# Patient Record
Sex: Male | Born: 1945 | Race: Black or African American | Hispanic: No | Marital: Single | Smoking: Never smoker
Health system: Southern US, Community
[De-identification: ages and names within clinical notes are randomized; demographics above are authoritative.]

## PROBLEM LIST (undated history)

## (undated) DIAGNOSIS — E7800 Pure hypercholesterolemia, unspecified: Secondary | ICD-10-CM

## (undated) DIAGNOSIS — H269 Unspecified cataract: Secondary | ICD-10-CM

## (undated) DIAGNOSIS — R1312 Dysphagia, oropharyngeal phase: Secondary | ICD-10-CM

## (undated) DIAGNOSIS — G20 Parkinson's disease: Secondary | ICD-10-CM

## (undated) DIAGNOSIS — H401130 Primary open-angle glaucoma, bilateral, stage unspecified: Secondary | ICD-10-CM

## (undated) DIAGNOSIS — F0390 Unspecified dementia without behavioral disturbance: Secondary | ICD-10-CM

## (undated) DIAGNOSIS — I10 Essential (primary) hypertension: Secondary | ICD-10-CM

## (undated) DIAGNOSIS — D509 Iron deficiency anemia, unspecified: Secondary | ICD-10-CM

## (undated) DIAGNOSIS — G20A1 Parkinson's disease without dyskinesia, without mention of fluctuations: Secondary | ICD-10-CM

## (undated) HISTORY — DX: Parkinson's disease: G20

## (undated) HISTORY — PX: CATARACT EXTRACTION, BILATERAL: SHX1313

## (undated) HISTORY — DX: Pure hypercholesterolemia, unspecified: E78.00

## (undated) HISTORY — DX: Unspecified cataract: H26.9

## (undated) HISTORY — DX: Dysphagia, oropharyngeal phase: R13.12

## (undated) HISTORY — DX: Essential (primary) hypertension: I10

## (undated) HISTORY — DX: Iron deficiency anemia, unspecified: D50.9

## (undated) HISTORY — DX: Parkinson's disease without dyskinesia, without mention of fluctuations: G20.A1

## (undated) HISTORY — DX: Primary open-angle glaucoma, bilateral, stage unspecified: H40.1130

---
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
## 2019-09-06 ENCOUNTER — Other Ambulatory Visit (HOSPITAL_COMMUNITY): Payer: Self-pay

## 2019-09-06 ENCOUNTER — Other Ambulatory Visit (HOSPITAL_COMMUNITY): Payer: Self-pay | Admitting: *Deleted

## 2019-09-06 DIAGNOSIS — R1310 Dysphagia, unspecified: Secondary | ICD-10-CM

## 2019-09-09 ENCOUNTER — Encounter (HOSPITAL_COMMUNITY): Payer: Self-pay

## 2019-09-26 ENCOUNTER — Ambulatory Visit (HOSPITAL_COMMUNITY)
Admission: RE | Admit: 2019-09-26 | Discharge: 2019-09-26 | Disposition: A | Discharge status: Home / Self Care | Payer: Medicare (Managed Care) | Source: Ambulatory Visit | Attending: Internal Medicine | Admitting: Internal Medicine

## 2019-09-26 ENCOUNTER — Other Ambulatory Visit: Payer: Self-pay

## 2019-09-26 DIAGNOSIS — R1310 Dysphagia, unspecified: Secondary | ICD-10-CM | POA: Diagnosis present

## 2019-10-17 ENCOUNTER — Ambulatory Visit (INDEPENDENT_AMBULATORY_CARE_PROVIDER_SITE_OTHER): Payer: Medicare (Managed Care) | Admitting: Neurology

## 2019-10-17 ENCOUNTER — Other Ambulatory Visit: Payer: Self-pay

## 2019-10-17 ENCOUNTER — Encounter: Payer: Self-pay | Admitting: Neurology

## 2019-10-17 VITALS — BP 122/71 | HR 87 | Temp 96.9°F | Ht 68.0 in | Wt 156.0 lb

## 2019-10-17 DIAGNOSIS — R269 Unspecified abnormalities of gait and mobility: Secondary | ICD-10-CM | POA: Diagnosis not present

## 2019-10-17 DIAGNOSIS — G20 Parkinson's disease: Secondary | ICD-10-CM | POA: Diagnosis not present

## 2019-10-17 DIAGNOSIS — G20A1 Parkinson's disease without dyskinesia, without mention of fluctuations: Secondary | ICD-10-CM | POA: Insufficient documentation

## 2019-10-17 MED ORDER — CARBIDOPA-LEVODOPA-ENTACAPONE 50-200-200 MG PO TABS: 1.0000 | ORAL_TABLET | Freq: Every day | ORAL | 11 refills | Status: DC

## 2019-10-17 NOTE — Progress Notes (Signed)
PATIENT: Eric Bennett DOB: 1946-06-15  Chief Complaint  Patient presents with  . Parkinson's Disease    He is here with his neice, Eric Bennett (also his POA). He is here for second opinion. Recently seen by Dr. Karl Bennett in [facility].   Marland Kitchen PCP    Entzminger, Mendel Corning, MD     HISTORICAL  Eric Bennett is Bennett 74 year old male, seen in request by his primary care physician Dr. Oris Bennett, Eric Bennett, accompanied by his niece Eric Bennett for evaluation of Parkinson's disease, initial evaluation was on October 17, 2019.  I have reviewed and summarized the referring note from the referring physician.  He has past medical history of hypertension, hyperlipidemia,  He was diagnosed with Parkinson's disease around 2013, presented with gait abnormality, has been treated with titrating dose of Sinemet, has been on current dose 25/250 mg 4 times Bennett day at 6, 10, 2, 6 PM per patient over the past few years,  Despite titrating dose of levodopa, he had Bennett gradual decline of his functional status, increased gait abnormality, rely on his walker, also have frequent freezing, difficulty initiate gait, fell multiple times,  He has slow worsening hypersialorrhea, wet slurred speech,   I reviewed the last neurology visit by Dr. Everette Bennett on August 26, 2019, increase the Sinemet and to 25/250 mg every 4 hours instead of 5 hours interval, also recommended speech and dysphagia evaluation, he is also taking pramipexole extended release 1.5 mg at bedtime, entacapone 200 mg 3 times Bennett day  MRI of the brain without contrast in February 2018: No evidence of acute abnormality, mild generalized atrophy,  REVIEW OF SYSTEMS: Full 14 system review of systems performed and notable only for as above All other review of systems were negative.  ALLERGIES: No Known Allergies  HOME MEDICATIONS: Current Outpatient Medications  Medication Sig Dispense Refill  . amLODipine (NORVASC) 5 MG tablet Take 1 tablet by mouth daily.     Marland Kitchen atorvastatin (LIPITOR) 40 MG tablet Take 1 tablet by mouth daily.    . carbidopa-levodopa (SINEMET IR) 25-250 MG tablet Take 1 tablet by mouth 4 (four) times Bennett week.    . Cholecalciferol (VITAMIN D3 PO) Take 1,000 Units by mouth daily.    . entacapone (COMTAN) 200 MG tablet Take 1 tablet by mouth daily.    . ferrous sulfate (FEROSUL) 325 (65 FE) MG tablet Take 325 mg by mouth daily.    Marland Kitchen lisinopril-hydrochlorothiazide (ZESTORETIC) 10-12.5 MG tablet Take 1 tablet by mouth daily.    . Pramipexole Dihydrochloride 1.5 MG TB24 Take 1 tablet by mouth at bedtime.     No current facility-administered medications for this visit.    PAST MEDICAL HISTORY: Past Medical History:  Diagnosis Date  . Bilateral cataracts   . Hypercholesteremia   . Hypertension   . Iron deficiency anemia   . Oropharyngeal dysphagia   . Parkinson disease ([place])   . Primary open angle glaucoma (POAG) of both eyes     PAST SURGICAL HISTORY: Past Surgical History:  Procedure Laterality Date  . CATARACT EXTRACTION, BILATERAL      FAMILY HISTORY: Family History  Problem Relation Age of Onset  . Stroke Mother   . Diabetes Mother   . Other Father        brain tumor - not sure if is was cancerous    SOCIAL HISTORY: Social History   Socioeconomic History  . Marital status: Single    Spouse name: Not on file  . Number  of children: 1  . Years of education: 17  . Highest education level: High school graduate  Occupational History  . Occupation: Retired  Tobacco Use  . Smoking status: Never Smoker  . Smokeless tobacco: Never Used  Substance and Sexual Activity  . Alcohol use: Not Currently  . Drug use: Never  . Sexual activity: Not on file  Other Topics Concern  . Not on file  Social History Narrative   Lives alone.   Right-handed.   One cup coffee per day.   Social Determinants of Health   Financial Resource Strain:   . Difficulty of Paying Living Expenses: Not on file  Food Insecurity:   .  Worried About Charity fundraiser in the Last Year: Not on file  . Ran Out of Food in the Last Year: Not on file  Transportation Needs:   . Lack of Transportation (Medical): Not on file  . Lack of Transportation (Non-Medical): Not on file  Physical Activity:   . Days of Exercise per Week: Not on file  . Minutes of Exercise per Session: Not on file  Stress:   . Feeling of Stress : Not on file  Social Connections:   . Frequency of Communication with Friends and Family: Not on file  . Frequency of Social Gatherings with Friends and Family: Not on file  . Attends Religious Services: Not on file  . Active Member of Clubs or Organizations: Not on file  . Attends Archivist Meetings: Not on file  . Marital Status: Not on file  Intimate Partner Violence:   . Fear of Current or Ex-Partner: Not on file  . Emotionally Abused: Not on file  . Physically Abused: Not on file  . Sexually Abused: Not on file     PHYSICAL EXAM   Vitals:   10/17/19 1340  BP: 122/71  Pulse: 87  Temp: (!) 96.9 F (36.1 C)  Weight: 156 lb (70.8 kg)  Height: 5\' 8"  (1.727 m)    Not recorded      Body mass index is 23.72 kg/m.  PHYSICAL EXAMNIATION:  Gen: NAD, conversant, well nourised, well groomed                     Cardiovascular: Regular rate rhythm, no peripheral edema, warm, nontender. Eyes: Conjunctivae clear without exudates or hemorrhage Neck: Supple, no carotid bruits. Pulmonary: Clear to auscultation bilaterally   NEUROLOGICAL EXAM:  MENTAL STATUS: Speech:    wet, slurred speech; fluent and spontaneous with normal comprehension.  Cognition:     Orientation to time, place and person     Normal recent and remote memory     Normal Attention span and concentration     Normal Language, naming, repeating,spontaneous speech     Fund of knowledge   CRANIAL NERVES: CN II: Visual fields are full to confrontation. Pupils are round equal and briskly reactive to light. CN III, IV, VI:  He has vertical eye movement disorder, no ptosis. CN V: Facial sensation is intact to light touch CN VII: Face is symmetric with normal eye closure  CN VIII: Hearing is normal to causal conversation. CN IX, X: Phonation is normal. CN XI: Head turning and shoulder shrug are intact  MOTOR: Examination was taken about 30 minutes after last dose of Sinemet, he only has mild right more than left upper and lower extremity rigidity, bradykinesia, no significant muscle weakness,  REFLEXES: Reflexes are hypoactive and symmetric at the biceps, triceps, knees, and  ankles. Plantar responses are flexor.  SENSORY: Intact to light touch, pinprick and vibratory sensation are intact in fingers and toes.  COORDINATION: There is no trunk or limb dysmetria noted.  GAIT/STANCE: He needs push-up to get up from seated position, leaning forward, decreased arm swing, frequent freezing,   DIAGNOSTIC DATA (LABS, IMAGING, TESTING) - I reviewed patient records, labs, notes, testing and imaging myself where available.   ASSESSMENT AND PLAN  Camil Trembath is Bennett 74 y.o. male    Idiopathic Parkinson's disease  Frequent freezing,  Will change him to Stalevo 200 mg 5 times Bennett day, every 3 hours  Refer him to Home physical therapy  Return to clinic in 2 months  Marcial Pacas, M.D. Ph.D.  [facility] [address] [place], [place] [address] Ph: [phone] Fax: [phone]  CC: Entzminger, Mendel Corning, MD

## 2019-12-19 ENCOUNTER — Ambulatory Visit: Payer: Medicare (Managed Care) | Admitting: Neurology

## 2019-12-19 ENCOUNTER — Ambulatory Visit (INDEPENDENT_AMBULATORY_CARE_PROVIDER_SITE_OTHER): Payer: Medicare (Managed Care) | Admitting: Neurology

## 2019-12-19 ENCOUNTER — Other Ambulatory Visit: Payer: Self-pay

## 2019-12-19 ENCOUNTER — Encounter: Payer: Self-pay | Admitting: Neurology

## 2019-12-19 VITALS — BP 123/80 | HR 106 | Temp 97.0°F | Ht 68.0 in | Wt 155.0 lb

## 2019-12-19 DIAGNOSIS — R269 Unspecified abnormalities of gait and mobility: Secondary | ICD-10-CM | POA: Diagnosis not present

## 2019-12-19 DIAGNOSIS — G20 Parkinson's disease: Secondary | ICD-10-CM | POA: Diagnosis not present

## 2019-12-19 MED ORDER — RASAGILINE MESYLATE 1 MG PO TABS: 1.0000 mg | ORAL_TABLET | Freq: Every day | ORAL | 11 refills | Status: DC

## 2019-12-19 NOTE — Progress Notes (Signed)
PATIENT: Eric Bennett DOB: 1946-04-27  Chief Complaint  Patient presents with  . Parkinson's Disease    He is here with his neice and POA, [name]. Reports his freezing and walking have both significantly improved since changing to Stalevo. He just completed physical therapy through [facility] which was also helpful. Any prescriptions need to be printed for [facility].     HISTORICAL  Eric Bennett is a 74 year old male, seen in request by his primary care physician Dr. Oris Drone, Telford Nab A, accompanied by [name] for evaluation of Parkinson's disease, initial evaluation was on October 17, 2019.  I have reviewed and summarized the referring note from the referring physician.  He has past medical history of hypertension, hyperlipidemia,  He was diagnosed with Parkinson's disease around 2013, presented with gait abnormality, has been treated with titrating dose of Sinemet, has been on current dose 25/250 mg 4 times a day at 6, 10, 2, 6 PM per patient over the past few years,  Despite titrating dose of levodopa, he had a gradual decline of his functional status, increased gait abnormality, rely on his walker, also have frequent freezing, difficulty initiate gait, fell multiple times,  He has slow worsening hypersialorrhea, wet slurred speech,  I reviewed the last neurology visit by Dr. Everette Rank on August 26, 2019, increase the Sinemet  to 25/250 mg every 4 hours instead of 5 hours interval, also recommended speech and dysphagia evaluation, he is also taking pramipexole extended release 1.5 mg at bedtime, entacapone 200 mg 3 times a day  MRI of the brain without contrast in February 2018: No evidence of acute abnormality, mild generalized atrophy,  UPDATE December 19 2019: Following last visit in January 2021, I have changed him to Stalevo 205 times a day at 6, 9, 12, 15, 18, (from previous Sinemet 25/250 mg 4 times a day, entacapone 200 mg 3 times a day), is also taking extended release  pramipexole 1.5 mg at bedtime, he can move much better, much less frequent freezing spells,  REVIEW OF SYSTEMS: Full 14 system review of systems performed and notable only for as above All other review of systems were negative.  ALLERGIES: No Known Allergies  HOME MEDICATIONS: Current Outpatient Medications  Medication Sig Dispense Refill  . amLODipine (NORVASC) 5 MG tablet Take 1 tablet by mouth daily.    Marland Kitchen atorvastatin (LIPITOR) 40 MG tablet Take 1 tablet by mouth daily.    . carbidopa-levodopa-entacapone (STALEVO 200) 50-200-200 MG tablet Take 1 tablet by mouth 5 (five) times daily. 150 tablet 11  . Cholecalciferol (VITAMIN D3 PO) Take 1,000 Units by mouth daily.    . ferrous sulfate (FEROSUL) 325 (65 FE) MG tablet Take 325 mg by mouth daily.    Marland Kitchen lisinopril-hydrochlorothiazide (ZESTORETIC) 10-12.5 MG tablet Take 1 tablet by mouth daily.    . Pramipexole Dihydrochloride 1.5 MG TB24 Take 1 tablet by mouth at bedtime.     No current facility-administered medications for this visit.    PAST MEDICAL HISTORY: Past Medical History:  Diagnosis Date  . Bilateral cataracts   . Hypercholesteremia   . Hypertension   . Iron deficiency anemia   . Oropharyngeal dysphagia   . Parkinson disease ([place])   . Primary open angle glaucoma (POAG) of both eyes     PAST SURGICAL HISTORY: Past Surgical History:  Procedure Laterality Date  . CATARACT EXTRACTION, BILATERAL      FAMILY HISTORY: Family History  Problem Relation Age of Onset  . Stroke Mother   .  Diabetes Mother   . Other Father        brain tumor - not sure if is was cancerous    SOCIAL HISTORY: Social History   Socioeconomic History  . Marital status: Single    Spouse name: Not on file  . Number of children: 1  . Years of education: 43  . Highest education level: High school graduate  Occupational History  . Occupation: Retired  Tobacco Use  . Smoking status: Never Smoker  . Smokeless tobacco: Never Used  Substance  and Sexual Activity  . Alcohol use: Not Currently  . Drug use: Never  . Sexual activity: Not on file  Other Topics Concern  . Not on file  Social History Narrative   Lives alone.   Right-handed.   One cup coffee per day.   Social Determinants of Health   Financial Resource Strain:   . Difficulty of Paying Living Expenses:   Food Insecurity:   . Worried About Charity fundraiser in the Last Year:   . Arboriculturist in the Last Year:   Transportation Needs:   . Film/video editor (Medical):   Marland Kitchen Lack of Transportation (Non-Medical):   Physical Activity:   . Days of Exercise per Week:   . Minutes of Exercise per Session:   Stress:   . Feeling of Stress :   Social Connections:   . Frequency of Communication with Friends and Family:   . Frequency of Social Gatherings with Friends and Family:   . Attends Religious Services:   . Active Member of Clubs or Organizations:   . Attends Archivist Meetings:   Marland Kitchen Marital Status:   Intimate Partner Violence:   . Fear of Current or Ex-Partner:   . Emotionally Abused:   Marland Kitchen Physically Abused:   . Sexually Abused:      PHYSICAL EXAM   Vitals:   12/19/19 0851  BP: 123/80  Pulse: (!) 106  Temp: (!) 97 F (36.1 C)  Weight: 155 lb (70.3 kg)  Height: 5\' 8"  (1.727 m)    Not recorded      Body mass index is 23.57 kg/m.  PHYSICAL EXAMNIATION:  Gen: NAD, conversant, well nourised, well groomed                     Cardiovascular: Regular rate rhythm, no peripheral edema, warm, nontender. Eyes: Conjunctivae clear without exudates or hemorrhage Neck: Supple, no carotid bruits. Pulmonary: Clear to auscultation bilaterally   NEUROLOGICAL EXAM:  MENTAL STATUS: Speech:    wet, slurred speech; fluent and spontaneous with normal comprehension.  Cognition:     Orientation to time, place and person     Normal recent and remote memory     Normal Attention span and concentration     Normal Language, naming,  repeating,spontaneous speech     Fund of knowledge   CRANIAL NERVES: CN II: Visual fields are full to confrontation. Pupils are round equal and briskly reactive to light. CN III, IV, VI: He has vertical eye movement disorder, no ptosis. CN V: Facial sensation is intact to light touch CN VII: Face is symmetric with normal eye closure  CN VIII: Hearing is normal to causal conversation. CN IX, X: Phonation is normal. CN XI: Head turning and shoulder shrug are intact  MOTOR: Mild to moderate right limb more than left rigidity, bradykinesia, no weakness,   REFLEXES: Reflexes are hypoactive and symmetric at the biceps, triceps, knees, and ankles.  Plantar responses are flexor.  SENSORY: Intact to light touch, pinprick and vibratory sensation are intact in fingers and toes.  COORDINATION: There is no trunk or limb dysmetria noted.  GAIT/STANCE: He needs push-up to get up from seated position, leaning forward, mildly decreased stride, occasionally freezing when turning, but much improved compared to previous study in January   DIAGNOSTIC DATA (LABS, IMAGING, TESTING) - I reviewed patient records, labs, notes, testing and imaging myself where available.   ASSESSMENT AND PLAN  Yezen Landman is a 74 y.o. male    Idiopathic Parkinson's disease  Frequent freezing,  Keep Stalevo 200 mg 5 times a day, every 3 hours  Pramipexole extended release 1.5 mg every night  Azilect 1 mg daily  He is in [facility], which has helped him significantly  Marcial Pacas, M.D. Ph.D.  [facility] [address] [place], [place] [address] Ph: [phone] Fax: [phone]  CC: Entzminger, Mendel Corning, MD

## 2019-12-23 ENCOUNTER — Encounter: Payer: Self-pay | Admitting: Neurology

## 2020-04-09 ENCOUNTER — Telehealth: Payer: Self-pay | Admitting: Neurology

## 2020-04-09 NOTE — Telephone Encounter (Signed)
I was able to talk with her niece Eric Bennett At [phone]  Patient was found to have heart rate variations, sometimes beating fast by his primary care physician Dr. Oris Drone, Mendel Corning, MD, per Eric Bennett this has raised the concern that his heart rate variations due to stalevo.  Patient does walk better with Stalevo 200mg  five times a day.  He was living by himself, not eating, drinking well, now Eric Bennett has taken him in, he is eating better, drinking more liquid, he has been more stable,  Keep current dose of  carbidopa-levodopa-entacapone (STALEVO 200) 50-200-200 MG tablet

## 2020-04-09 NOTE — Telephone Encounter (Signed)
Dr. Oris Drone from [facility] called needing to speak to provider about some changes that she and the caregiver have noticed. BP seems to be affected by the medications he takes for Parkinson's. Please call to discuss.

## 2020-04-09 NOTE — Telephone Encounter (Signed)
Dr. Oris Drone [phone]

## 2020-04-21 ENCOUNTER — Telehealth: Payer: Self-pay | Admitting: Neurology

## 2020-04-21 NOTE — Telephone Encounter (Signed)
Pt's niece [name], on [place] called wanting to discuss the pt's carbidopa-levodopa-entacapone (STALEVO 200) 50-200-200 MG tablet She states that his heart rate gets fast when he is on this medication and she is wanting to be advised if the medication should be changed or if the dosage should be lowered.

## 2020-04-21 NOTE — Telephone Encounter (Signed)
04/09/20 4:08 PM Note I was able to talk with her niece [name] At [phone]  Patient was found to have heart rate variations, sometimes beating fast by his primary care physician Dr. Oris Drone, Mendel Corning, MD, per [name] this has raised the concern that his heart rate variations due to stalevo.  Patient does walk better with Stalevo 200mg  five times a day.  He was living by himself, not eating, drinking well, now [name] has taken him in, he is eating better, drinking more liquid, he has been more stable,  Keep current dose of  carbidopa-levodopa-entacapone (STALEVO 200) 50-200-200 MG tablet     Reviewed the phone call with her, from Dr. Krista Blue, on 04/09/20. The patient's heart rate is still fluctuating. She plans to contact his PCP today for further evaluation.

## 2020-05-05 ENCOUNTER — Telehealth: Payer: Self-pay | Admitting: Neurology

## 2020-05-05 NOTE — Telephone Encounter (Signed)
The patient's follow up has been moved to an earlier date w/ Dr. Krista Blue. Appt scheduled on 06/04/20 at 9am with arrival time of 8:30am for check-in. I called Ria Comment back at [facility]. I left the new date and time on her voicemail. I also provided our number for her or the patient to call back, if this does not work well.

## 2020-05-05 NOTE — Telephone Encounter (Signed)
Ria Comment from [facility] called stating that the pt's PCP is wanting the pt to be seen in the next 2-3 weeks. There are no sooner appts for MD nor NP. Please advise.

## 2020-06-04 ENCOUNTER — Encounter: Payer: Self-pay | Admitting: Neurology

## 2020-06-04 ENCOUNTER — Telehealth: Payer: Self-pay | Admitting: Neurology

## 2020-06-04 ENCOUNTER — Ambulatory Visit (INDEPENDENT_AMBULATORY_CARE_PROVIDER_SITE_OTHER): Payer: Medicare (Managed Care) | Admitting: Neurology

## 2020-06-04 VITALS — BP 100/67 | HR 90 | Ht 68.0 in | Wt 143.5 lb

## 2020-06-04 DIAGNOSIS — R269 Unspecified abnormalities of gait and mobility: Secondary | ICD-10-CM

## 2020-06-04 DIAGNOSIS — R413 Other amnesia: Secondary | ICD-10-CM | POA: Diagnosis not present

## 2020-06-04 DIAGNOSIS — G20 Parkinson's disease: Secondary | ICD-10-CM | POA: Diagnosis not present

## 2020-06-04 HISTORY — DX: Other amnesia: R41.3

## 2020-06-04 MED ORDER — RASAGILINE MESYLATE 1 MG PO TABS: 1.0000 mg | ORAL_TABLET | Freq: Every day | ORAL | 4 refills | Status: DC

## 2020-06-04 MED ORDER — CARBIDOPA-LEVODOPA-ENTACAPONE 50-200-200 MG PO TABS: 1.0000 | ORAL_TABLET | Freq: Every day | ORAL | 11 refills | Status: DC

## 2020-06-04 NOTE — Telephone Encounter (Signed)
06/04/2020 -- Entzminger, Mendel Corning, MD Called and Relayed to [employer] for MRI because she is [facility]CP [facility] and she has to Order . Dr. Krista Blue will be able to see MRI in Epic . I will make Dr. Krista Blue and Raquel Sarna aware. Eric Bennett

## 2020-06-04 NOTE — Telephone Encounter (Signed)
Noted, thank you

## 2020-06-04 NOTE — Telephone Encounter (Signed)
[facility] order sent to [facility]. No auth they will reach out to the patient to schedule.  

## 2020-06-04 NOTE — Progress Notes (Addendum)
PATIENT: Eric Bennett DOB: May 20, 1946  Chief Complaint  Patient presents with  . Parkinson's disease    hall rm, 6 month FU, niece- Eric Bennett, "has fallen; also discuss Iron tabs re: tachycardia"     HISTORICAL  Eric Bennett is a 74 year old male, seen in request by his primary care physician Dr. Oris Drone, Telford Nab A, accompanied by his niece Eric Bennett for evaluation of Parkinson's disease, initial evaluation was on October 17, 2019.  I have reviewed and summarized the referring note from the referring physician.  He has past medical history of hypertension, hyperlipidemia,  He was diagnosed with Parkinson's disease around 2013, presented with gait abnormality, has been treated with titrating dose of Sinemet, has been on current dose 25/250 mg 4 times a day at 6, 10, 2, 6 PM per patient over the past few years,  Despite titrating dose of levodopa, he had a gradual decline of his functional status, increased gait abnormality, rely on his walker, also have frequent freezing, difficulty initiate gait, fell multiple times,  He has slow worsening hypersialorrhea, wet slurred speech,  I reviewed the last neurology visit by Dr. Everette Rank on August 26, 2019, increase the Sinemet  to 25/250 mg every 4 hours instead of 5 hours interval, also recommended speech and dysphagia evaluation, he is also taking pramipexole extended release 1.5 mg at bedtime, entacapone 200 mg 3 times a day  MRI of the brain without contrast in February 2018: No evidence of acute abnormality, mild generalized atrophy,  UPDATE December 19 2019: Following last visit in January 2021, I have changed him to Stalevo 200 5 times a day at 57, 9, 43, 15, 18, (from previous Sinemet 25/250 mg 4 times a day, entacapone 200 mg 3 times a day), is also taking extended release pramipexole 1.5 mg at bedtime, he can move much better, much less frequent freezing spells,   UPDATE Sept 16 2021: He is accompanied by his niece Eric Bennett at today's visit,  will check on him on a daily basis, he also has caregiver with him majority of the day, he still lives by himself,  He was noted to have significant improvement for a while with change of his Parkinson medications, to Stalevo 200 5 times a day, but it is difficult for him to get 5 doses in due to his participate in pace program, he is now only take 4 times a day,  There was acute change few weeks ago, he become required, with increased gait abnormality, hard of hearing, increased memory loss, rely on his niece to provide most the history,   REVIEW OF SYSTEMS: Full 14 system review of systems performed and notable only for as above All other review of systems were negative.  ALLERGIES: No Known Allergies  HOME MEDICATIONS: Current Outpatient Medications  Medication Sig Dispense Refill  . amLODipine (NORVASC) 5 MG tablet Take 1 tablet by mouth daily.    Marland Kitchen atorvastatin (LIPITOR) 40 MG tablet Take 1 tablet by mouth daily.    . carbidopa-levodopa-entacapone (STALEVO 200) 50-200-200 MG tablet Take 1 tablet by mouth 5 (five) times daily. 150 tablet 11  . Cholecalciferol (VITAMIN D3 PO) Take 1,000 Units by mouth daily.    Marland Kitchen lisinopril-hydrochlorothiazide (ZESTORETIC) 10-12.5 MG tablet Take 1 tablet by mouth daily.    . Pramipexole Dihydrochloride 1.5 MG TB24 Take 1 tablet by mouth at bedtime.    . rasagiline (AZILECT) 1 MG TABS tablet Take 1 tablet (1 mg total) by mouth daily. 30 tablet 11  . ferrous  sulfate (FEROSUL) 325 (65 FE) MG tablet Take 325 mg by mouth daily. (Patient not taking: Reported on 06/04/2020)     No current facility-administered medications for this visit.    PAST MEDICAL HISTORY: Past Medical History:  Diagnosis Date  . Bilateral cataracts   . Hypercholesteremia   . Hypertension   . Iron deficiency anemia   . Oropharyngeal dysphagia   . Parkinson disease ([place])   . Primary open angle glaucoma (POAG) of both eyes     PAST SURGICAL HISTORY: Past Surgical History:   Procedure Laterality Date  . CATARACT EXTRACTION, BILATERAL      FAMILY HISTORY: Family History  Problem Relation Age of Onset  . Stroke Mother   . Diabetes Mother   . Other Father        brain tumor - not sure if is was cancerous    SOCIAL HISTORY: Social History   Socioeconomic History  . Marital status: Single    Spouse name: Not on file  . Number of children: 1  . Years of education: 62  . Highest education level: High school graduate  Occupational History  . Occupation: Retired  Tobacco Use  . Smoking status: Never Smoker  . Smokeless tobacco: Never Used  Substance and Sexual Activity  . Alcohol use: Not Currently  . Drug use: Never  . Sexual activity: Not on file  Other Topics Concern  . Not on file  Social History Narrative   06/04/20 Lives alone. Caregivers daily   Right-handed.   One cup coffee per day.   Social Determinants of Health   Financial Resource Strain:   . Difficulty of Paying Living Expenses: Not on file  Food Insecurity:   . Worried About Charity fundraiser in the Last Year: Not on file  . Ran Out of Food in the Last Year: Not on file  Transportation Needs:   . Lack of Transportation (Medical): Not on file  . Lack of Transportation (Non-Medical): Not on file  Physical Activity:   . Days of Exercise per Week: Not on file  . Minutes of Exercise per Session: Not on file  Stress:   . Feeling of Stress : Not on file  Social Connections:   . Frequency of Communication with Friends and Family: Not on file  . Frequency of Social Gatherings with Friends and Family: Not on file  . Attends Religious Services: Not on file  . Active Member of Clubs or Organizations: Not on file  . Attends Archivist Meetings: Not on file  . Marital Status: Not on file  Intimate Partner Violence:   . Fear of Current or Ex-Partner: Not on file  . Emotionally Abused: Not on file  . Physically Abused: Not on file  . Sexually Abused: Not on file      PHYSICAL EXAM   Vitals:   06/04/20 0857  BP: 100/67  Pulse: 90  Weight: 143 lb 8 oz (65.1 kg)  Height: 5\' 8"  (1.727 m)   Not recorded     Body mass index is 21.82 kg/m.  PHYSICAL EXAMNIATION:  Gen: NAD, conversant, well nourised, well groomed           NEUROLOGICAL EXAM:  MENTAL STATUS: MMSE - Mini Mental State Exam 06/04/2020  Orientation to time 1  Orientation to Place 2  Registration 3  Attention/ Calculation 3  Recall 1  Language- name 2 objects 2  Language- repeat 1  Language- follow 3 step command 3  Language- read &  follow direction 1  Write a sentence 0  Copy design 0  Total score 17  Soft voice, stuttering, follow commands,   CRANIAL NERVES: CN II: Visual fields are full to confrontation. Pupils are round equal and briskly reactive to light. CN III, IV, VI: He has vertical eye movement disorder, no ptosis. CN V: Facial sensation is intact to light touch CN VII: Face is symmetric with normal eye closure  CN VIII: Hearing is normal to causal conversation. CN IX, X: Phonation is normal. CN XI: Head turning and shoulder shrug are intact  MOTOR: Mild to moderate right limb more than left rigidity, bradykinesia, no weakness,   REFLEXES: Reflexes are hypoactive and symmetric at the biceps, triceps, knees, and ankles. Plantar responses are flexor.  SENSORY: Intact to light touch, pinprick and vibratory sensation are intact in fingers and toes.  COORDINATION: There is no trunk or limb dysmetria noted.  GAIT/STANCE: He needs push-up to get up from seated position, leaning forward, mildly decreased stride, occasionally freezing when turning,    DIAGNOSTIC DATA (LABS, IMAGING, TESTING) - I reviewed patient records, labs, notes, testing and imaging myself where available.   ASSESSMENT AND PLAN  Toretto Tingler is a 74 y.o. male    Idiopathic Parkinson's disease Acute onset of memory loss,  MRI of the brain to rule out central nervous system  structural lesion such as stroke-will be ordered by his PCP, he is at [facility]  Keep Stalevo 200 mg 5 times a day, every 3 hours  Stop Mirapex extended release 1.5 mg every night  Keep Azilect 1 mg daily  He is in [facility] program, which has helped him significantly, receiving physical therapy regularly, attending day program  Eric Bennett, M.D. Ph.D.  [facility] [address] [place], [place] [address] Ph: [phone] Fax: [phone]  CC: Entzminger, Eric Corning, MD

## 2020-06-04 NOTE — Patient Instructions (Addendum)
Stop Mirapex  Change Stalevo 200mg  to 8am, 11am, 2pm, 5pm, 8pm.

## 2020-06-05 ENCOUNTER — Encounter: Payer: Self-pay | Admitting: Neurology

## 2020-06-08 ENCOUNTER — Other Ambulatory Visit: Payer: Self-pay | Admitting: Internal Medicine

## 2020-06-08 DIAGNOSIS — G20 Parkinson's disease: Secondary | ICD-10-CM

## 2020-06-08 NOTE — Addendum Note (Signed)
Addended by: Marcial Pacas on: 06/08/2020 02:06 PM   Modules accepted: Orders

## 2020-06-15 ENCOUNTER — Telehealth: Payer: Self-pay | Admitting: Neurology

## 2020-06-15 NOTE — Telephone Encounter (Signed)
Patient was on Stalevo four times a day before it was increased to five times a day.  If his blood pressure stabilize, and he still has trouble walking, may increase the dosage back to four times a day.  If patient or she are happy about his walking, it is ok to stay on 3 tablets a day

## 2020-06-15 NOTE — Telephone Encounter (Signed)
Pt called, Dr. Krista Blue increase carbidopa-levodopa-entacapone (STALEVO 200) 50-200-200 MG tablet to 5 times a day. Started to effect his BP, lowed to 92/67, HR 132. Ms. Kenton Kingfisher called [facility], [facility] instructed Pt to take 3 times ad day. [facility] suggested notify Dr. Krista Blue.

## 2020-06-15 NOTE — Telephone Encounter (Signed)
I returned the call to the patient's niece who verbalized understanding of the plan below. She will attend his next follow up with him on 09/02/20. She will call prior to this date with any questions.

## 2020-06-15 NOTE — Telephone Encounter (Signed)
Chart reviewed, most recent office visit June 04, 2020, niece Eric Bennett reported that he was doing better with Stalevo 200 mg, higher dose, was on 4 tablets a day, increased to 5 times a day,  Please double check with his niece how patient is doing, the exact dosage of his medications,  He is also on 2 agents for his blood pressure, Norvasc, Zestoretic,   May consider other possibilities for his low blood pressure, such as urinary tract infection, upper respiratory infection, too much of the blood pressure medications.

## 2020-06-15 NOTE — Telephone Encounter (Signed)
I called his niece [name] (caregiver on [place]). Reports after the patient started Stalevo five times daily, he began to have the following symptoms: drowsiness, decreased appetite, decreased BP. She was unable to assess if the increased dosage actually helped with his movements because he was not able to get out of the bed. Blood pressures were documented at lowest point of 87/59 and 84/54.  She called [facility] because our phones were out of order on day of her concern. She was instructed to decrease Stalevo to three times daily and call us today. She is currently giving it to him at 8am, 2pm and 7:30pm. His most recent vitals were 114/70, 74 - 92/57, 133 - 107/77, 95. She feels he is much more alert on the lower dose and eating better. He is still having some freezing spells throughout the day. He has also continued Azilect 1mg  daily.   She would like to know if any other changes need to be made to his therapy.

## 2020-06-25 ENCOUNTER — Ambulatory Visit: Payer: Medicare (Managed Care) | Admitting: Neurology

## 2020-06-27 ENCOUNTER — Ambulatory Visit
Admission: RE | Admit: 2020-06-27 | Discharge: 2020-06-27 | Disposition: A | Discharge status: Home / Self Care | Payer: Medicare (Managed Care) | Source: Ambulatory Visit | Attending: Internal Medicine | Admitting: Internal Medicine

## 2020-06-27 ENCOUNTER — Other Ambulatory Visit: Payer: Medicare (Managed Care)

## 2020-06-27 DIAGNOSIS — G20 Parkinson's disease: Secondary | ICD-10-CM

## 2020-09-02 ENCOUNTER — Ambulatory Visit (INDEPENDENT_AMBULATORY_CARE_PROVIDER_SITE_OTHER): Payer: Medicare (Managed Care) | Admitting: Neurology

## 2020-09-02 ENCOUNTER — Encounter: Payer: Self-pay | Admitting: Neurology

## 2020-09-02 ENCOUNTER — Telehealth: Payer: Self-pay | Admitting: Neurology

## 2020-09-02 VITALS — BP 102/68 | HR 122 | Ht 68.0 in | Wt 143.5 lb

## 2020-09-02 DIAGNOSIS — R269 Unspecified abnormalities of gait and mobility: Secondary | ICD-10-CM

## 2020-09-02 DIAGNOSIS — M4802 Spinal stenosis, cervical region: Secondary | ICD-10-CM | POA: Diagnosis not present

## 2020-09-02 DIAGNOSIS — G20 Parkinson's disease: Secondary | ICD-10-CM | POA: Diagnosis not present

## 2020-09-02 DIAGNOSIS — R413 Other amnesia: Secondary | ICD-10-CM

## 2020-09-02 DIAGNOSIS — G20A1 Parkinson's disease without dyskinesia, without mention of fluctuations: Secondary | ICD-10-CM

## 2020-09-02 NOTE — Progress Notes (Signed)
PATIENT: Eric Bennett DOB: [birth year]  Chief Complaint  Patient presents with  . Parkinson's Disease    He is here with his niece, Eric Bennett. They would like to review his MRI of brain. Recent MMSE 17/30. He is currently taking Stalevo three times daily and Azilect once daily. Reports gait and cognition have improved with the decreased dose of Stalevo. No issues with freezing spells. No falls.     HISTORICAL  Eric Bennett is a 74 year old male, seen in request by his primary care physician Dr. Oris Drone, Telford Nab A, accompanied by his niece Eric Bennett for evaluation of Parkinson's disease, initial evaluation was on October 17, 2019.  I have reviewed and summarized the referring note from the referring physician.  He has past medical history of hypertension, hyperlipidemia,  He was diagnosed with Parkinson's disease around 2013, presented with gait abnormality, has been treated with titrating dose of Sinemet, has been on current dose 25/250 mg 4 times a day at 6, 10, 2, 6 PM per patient over the past few years,  Despite titrating dose of levodopa, he had a gradual decline of his functional status, increased gait abnormality, rely on his walker, also have frequent freezing, difficulty initiate gait, fell multiple times,  He has slow worsening hypersialorrhea, wet slurred speech,  I reviewed the last neurology visit by Eric Bennett on August 26, 2019, increase the Sinemet  to 25/250 mg every 4 hours instead of 5 hours interval, also recommended speech and dysphagia evaluation, he is also taking pramipexole extended release 1.5 mg at bedtime, entacapone 200 mg 3 times a day  MRI of the brain without contrast in February 2018: No evidence of acute abnormality, mild generalized atrophy,  UPDATE December 19 2019: Following last visit in January 2021, I have changed him to Stalevo 200 5 times a day at 71, 9, 12, 15, 18, (from previous Sinemet 25/250 mg 4 times a day, entacapone 200 mg 3 times a day), is  also taking extended release pramipexole 1.5 mg at bedtime, he can move much better, much less frequent freezing spells,   UPDATE Sept 16 2021: He is accompanied by his niece Eric Bennett at today's visit, will check on him on a daily basis, he also has caregiver with him majority of the day, he still lives by himself,  He was noted to have significant improvement for a while with change of his Parkinson medications, to Stalevo 200 5 times a day, but it is difficult for him to get 5 doses in due to his participate in pace program, he is now only take 4 times a day,  There was acute change few weeks ago, he become quiet with increased gait abnormality, hard of hearing, increased memory loss, rely on his niece to provide most the history,  UPDATE Sep 02 2020: He is accompanied by his niece Eric Bennett at today's visit, overall doing very well, only taking Stalevo 200 3 times daily,  I personally reviewed MRI in October 2021, mild generalized atrophy, supratentorium small vessel disease, no acute abnormality, 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,  Patient has urinary frequency, ambulate okay with walker, has caregiver 6 hours each day, his niece Eric Bennett check on him daily   REVIEW OF SYSTEMS: Full 14 system review of systems performed and notable only for as above All other review of systems were negative.  ALLERGIES: No Known Allergies  HOME MEDICATIONS: Current Outpatient Medications  Medication Sig Dispense Refill  . atorvastatin (LIPITOR) 40 MG tablet Take 1  tablet by mouth daily.    . carbidopa-levodopa-entacapone (STALEVO 200) 50-200-200 MG tablet Take 1 tablet by mouth 5 (five) times daily. 150 tablet 11  . Cholecalciferol (VITAMIN D3 PO) Take 25 mg by mouth daily.    Marland Kitchen lisinopril-hydrochlorothiazide (ZESTORETIC) 10-12.5 MG tablet Take 1 tablet by mouth daily.    . potassium chloride SA (KLOR-CON) 20 MEQ tablet Take 20 mEq by mouth daily.    . rasagiline (AZILECT) 1 MG  TABS tablet Take 1 tablet (1 mg total) by mouth daily. 90 tablet 4   No current facility-administered medications for this visit.    PAST MEDICAL HISTORY: Past Medical History:  Diagnosis Date  . Bilateral cataracts   . Hypercholesteremia   . Hypertension   . Iron deficiency anemia   . Oropharyngeal dysphagia   . Parkinson disease ([place])   . Primary open angle glaucoma (POAG) of both eyes     PAST SURGICAL HISTORY: Past Surgical History:  Procedure Laterality Date  . CATARACT EXTRACTION, BILATERAL      FAMILY HISTORY: Family History  Problem Relation Age of Onset  . Stroke Mother   . Diabetes Mother   . Other Father        brain tumor - not sure if is was cancerous    SOCIAL HISTORY: Social History   Socioeconomic History  . Marital status: Single    Spouse name: Not on file  . Number of children: 1  . Years of education: 75  . Highest education level: High school graduate  Occupational History  . Occupation: Retired  Tobacco Use  . Smoking status: Never Smoker  . Smokeless tobacco: Never Used  Substance and Sexual Activity  . Alcohol use: Not Currently  . Drug use: Never  . Sexual activity: Not on file  Other Topics Concern  . Not on file  Social History Narrative   06/04/20 Lives alone. Caregivers daily   Right-handed.   One cup coffee per day.   Social Determinants of Health   Financial Resource Strain: Not on file  Food Insecurity: Not on file  Transportation Needs: Not on file  Physical Activity: Not on file  Stress: Not on file  Social Connections: Not on file  Intimate Partner Violence: Not on file     PHYSICAL EXAM   Vitals:   09/02/20 1114  BP: 102/68  Pulse: (!) 122  Weight: 143 lb 8 oz (65.1 kg)  Height: 5\' 8"  (1.727 m)   Not recorded     Body mass index is 21.82 kg/m.  PHYSICAL EXAMNIATION:  Gen: NAD, conversant, well nourised, well groomed           NEUROLOGICAL EXAM:  MENTAL STATUS: MMSE - Mini Mental State Exam  06/04/2020  Orientation to time 1  Orientation to Place 2  Registration 3  Attention/ Calculation 3  Recall 1  Language- name 2 objects 2  Language- repeat 1  Language- follow 3 step command 3  Language- read & follow direction 1  Write a sentence 0  Copy design 0  Total score 17  Soft voice, stuttering, follow commands,   CRANIAL NERVES: CN II: Visual fields are full to confrontation. Pupils are round equal and briskly reactive to light. CN III, IV, VI: He has vertical eye movement disorder, no ptosis. CN V: Facial sensation is intact to light touch CN VII: Face is symmetric with normal eye closure  CN VIII: Hearing is normal to causal conversation. CN IX, X: Phonation is normal. CN XI:  Head turning and shoulder shrug are intact  MOTOR: Mild to moderate right limb more than left rigidity, bradykinesia, no weakness,   REFLEXES: Reflexes are hypoactive and symmetric at the biceps, triceps, 2/2 at knees, and trace at ankles. Plantar responses are flexor.  SENSORY: Intact to light touch, pinprick and vibratory sensation are intact in fingers and toes.  COORDINATION: There is no trunk or limb dysmetria noted.  GAIT/STANCE: He needs push-up to get up from seated position, leaning forward, relies his walker.   DIAGNOSTIC DATA (LABS, IMAGING, TESTING) - I reviewed patient records, labs, notes, testing and imaging myself where available.   ASSESSMENT AND PLAN  Eric Bennett is a 74 y.o. male    Idiopathic Parkinson's disease Memory loss,  MRI of the brain to rule out central nervous system structural lesion such as stroke-will be ordered by his PCP, he is at [facility] 200 mg 3 times a day at 8, 12, 4pm  Stop Mirapex extended release 1.5 mg every night  Keep Azilect 1 mg daily  He is in [facility] program, which has helped him significantly, receiving physical therapy regularly, attending day program.  Cervical Stenosis  MRI cervical    Eric Bennett, M.D.  Ph.D.  [facility] [address] [place], [place] [address] Ph: [phone] Fax: [phone]  CC: Entzminger, Mendel Corning, MD

## 2020-09-02 NOTE — Telephone Encounter (Signed)
[facility] order sent to [facility]. No auth they will reach out to the patient to schedule.

## 2020-09-15 ENCOUNTER — Ambulatory Visit
Admission: RE | Admit: 2020-09-15 | Discharge: 2020-09-15 | Disposition: A | Discharge status: Home / Self Care | Payer: Medicare (Managed Care) | Source: Ambulatory Visit | Attending: Neurology | Admitting: Neurology

## 2020-09-15 DIAGNOSIS — M4802 Spinal stenosis, cervical region: Secondary | ICD-10-CM | POA: Diagnosis not present

## 2020-09-21 ENCOUNTER — Telehealth: Payer: Self-pay | Admitting: *Deleted

## 2020-09-21 NOTE — Telephone Encounter (Signed)
I spoke to the patient's niece, Eric Bennett, on [place]. She verbalized understanding of the MRI results and will call his PCP to follow up. A copy of the cervical MRI report has been faxed to his PCP, Dr. Sherrin Daisy, at [phone]. Fax confirmation received.

## 2020-09-21 NOTE — Telephone Encounter (Signed)
-----   Message from Huston Foley, MD sent at 09/21/2020  9:27 AM EST ----- Please call patient regarding his neck MRI without contrast.  There are degenerative changes at multiple levels, but nothing that would warrant immediate surgical referral, in particular, no herniated disc or compression of the spinal cord.  The degenerative/arthritic changes are most prominent at C3-4.  Incidentally, there is also a soft tissue swelling in the neck skin and soft tissue in the back.  I would recommend follow-up with primary care physician to evaluate for the soft tissue swelling.

## 2020-09-21 NOTE — Progress Notes (Signed)
Please call patient regarding his neck MRI without contrast.  There are degenerative changes at multiple levels, but nothing that would warrant immediate surgical referral, in particular, no herniated disc or compression of the spinal cord.  The degenerative/arthritic changes are most prominent at C3-4.  Incidentally, there is also a soft tissue swelling in the neck skin and soft tissue in the back.  I would recommend follow-up with primary care physician to evaluate for the soft tissue swelling.

## 2020-09-29 ENCOUNTER — Ambulatory Visit: Payer: Medicare (Managed Care) | Admitting: Gastroenterology

## 2020-11-03 ENCOUNTER — Other Ambulatory Visit (INDEPENDENT_AMBULATORY_CARE_PROVIDER_SITE_OTHER): Payer: Medicare (Managed Care)

## 2020-11-03 ENCOUNTER — Ambulatory Visit (INDEPENDENT_AMBULATORY_CARE_PROVIDER_SITE_OTHER): Payer: Medicare (Managed Care) | Admitting: Gastroenterology

## 2020-11-03 ENCOUNTER — Encounter: Payer: Self-pay | Admitting: Gastroenterology

## 2020-11-03 VITALS — BP 120/84 | HR 80 | Ht 68.0 in | Wt 152.0 lb

## 2020-11-03 DIAGNOSIS — R195 Other fecal abnormalities: Secondary | ICD-10-CM | POA: Diagnosis not present

## 2020-11-03 DIAGNOSIS — R1312 Dysphagia, oropharyngeal phase: Secondary | ICD-10-CM

## 2020-11-03 DIAGNOSIS — D500 Iron deficiency anemia secondary to blood loss (chronic): Secondary | ICD-10-CM

## 2020-11-03 DIAGNOSIS — K5909 Other constipation: Secondary | ICD-10-CM | POA: Diagnosis not present

## 2020-11-03 LAB — VITAMIN B12: Vitamin B-12: 395 pg/mL (ref 211–911)

## 2020-11-03 LAB — CBC WITH DIFFERENTIAL/PLATELET
Basophils Absolute: 0 10*3/uL (ref 0.0–0.1)
Basophils Relative: 0.7 % (ref 0.0–3.0)
Eosinophils Absolute: 0.1 10*3/uL (ref 0.0–0.7)
Eosinophils Relative: 1.7 % (ref 0.0–5.0)
HCT: 36.8 % — ABNORMAL LOW (ref 39.0–52.0)
Hemoglobin: 12.2 g/dL — ABNORMAL LOW (ref 13.0–17.0)
Lymphocytes Relative: 29.3 % (ref 12.0–46.0)
Lymphs Abs: 1.1 10*3/uL (ref 0.7–4.0)
MCHC: 33 g/dL (ref 30.0–36.0)
MCV: 78.1 fl (ref 78.0–100.0)
Monocytes Absolute: 0.4 10*3/uL (ref 0.1–1.0)
Monocytes Relative: 11.2 % (ref 3.0–12.0)
Neutro Abs: 2.2 10*3/uL (ref 1.4–7.7)
Neutrophils Relative %: 57.1 % (ref 43.0–77.0)
Platelets: 197 10*3/uL (ref 150.0–400.0)
RBC: 4.72 Mil/uL (ref 4.22–5.81)
RDW: 16.1 % — ABNORMAL HIGH (ref 11.5–15.5)
WBC: 3.9 10*3/uL — ABNORMAL LOW (ref 4.0–10.5)

## 2020-11-03 LAB — COMPREHENSIVE METABOLIC PANEL
ALT: 4 U/L (ref 0–53)
AST: 17 U/L (ref 0–37)
Albumin: 4.2 g/dL (ref 3.5–5.2)
Alkaline Phosphatase: 62 U/L (ref 39–117)
BUN: 12 mg/dL (ref 6–23)
CO2: 30 mEq/L (ref 19–32)
Calcium: 9.8 mg/dL (ref 8.4–10.5)
Chloride: 103 mEq/L (ref 96–112)
Creatinine, Ser: 0.78 mg/dL (ref 0.40–1.50)
GFR: 87.31 mL/min (ref >60.00)
Glucose, Bld: 98 mg/dL (ref 70–99)
Potassium: 3.4 mEq/L — ABNORMAL LOW (ref 3.5–5.1)
Sodium: 140 mEq/L (ref 135–145)
Total Bilirubin: 0.5 mg/dL (ref 0.2–1.2)
Total Protein: 7.9 g/dL (ref 6.0–8.3)

## 2020-11-03 LAB — FOLATE: Folate: >23.6 ng/mL (ref >5.9)

## 2020-11-03 LAB — IBC + FERRITIN
Ferritin: 10 ng/mL — ABNORMAL LOW (ref 22.0–322.0)
Iron: 36 ug/dL — ABNORMAL LOW (ref 42–165)
Saturation Ratios: 9.5 % — ABNORMAL LOW (ref 20.0–50.0)
Transferrin: 271 mg/dL (ref 212.0–360.0)

## 2020-11-03 NOTE — Patient Instructions (Signed)
If you are age 76 or older, your body mass index should be between 23-30. Your Body mass index is 23.11 kg/m. If this is out of the aforementioned range listed, please consider follow up with your Primary Care Provider.  If you are age 92 or younger, your body mass index should be between 19-25. Your Body mass index is 23.11 kg/m. If this is out of the aformentioned range listed, please consider follow up with your Primary Care Provider.   Your provider has requested that you go to the basement level for lab work before leaving today. Press "B" on the elevator. The lab is located at the first door on the left as you exit the elevator.  Due to recent changes in healthcare laws, you may see the results of your imaging and laboratory studies on MyChart before your provider has had a chance to review them.  We understand that in some cases there may be results that are confusing or concerning to you. Not all laboratory results come back in the same time frame and the provider may be waiting for multiple results in order to interpret others.  Please give [place] 48 hours in order for your provider to thoroughly review all the results before contacting the office for clarification of your results.   We will contact [facility] to set up the EGD/Colon   It was a pleasure to see you today!  Dr. Loletha Carrow

## 2020-11-03 NOTE — Progress Notes (Signed)
[facility] Consult Note:  History: Eric Bennett 11/03/2020  Referring provider: Waylan Rocher, MD  Reason for consult/chief complaint: Blood In Stools (Hemoccult positive stools, iron def anemia, patient has dark, tarry stools)   Subjective  HPI:  This is a pleasant 75 year old man with Parkinson's disease accompanied by his niece after referral by primary care for anemia and heme positive stool. No primary care records were available at the time of visit, but some labs arrived by fax after the office visit today and are described below. Eric Bennett has dysarthria related to his Parkinson's, so his niece gives the history today. He is able to answer some yes/no questions regarding his symptoms. Apparently a home health aide noticed an abnormal color stool that he thought might be blood. Labs a few months back showed anemia and reportedly iron deficiency according to the referral. He has had no further labs since then as near as we know. He has chronic constipation for which he is on a stool softener at this point. He denies food or liquids feeling stuck in the chest, but he has some oropharyngeal dysphagia that prompted a modified barium study last year (report below). Micheil denies nausea vomiting or abdominal pain.  His niece believes he had a colonoscopy in [place] perhaps about 5 years ago.   ROS:  Review of Systems  Constitutional: Negative for appetite change and unexpected weight change.  HENT: Negative for mouth sores and voice change.   Eyes: Negative for pain and redness.       Decreased visual acuity from left eyelid droop and glaucoma  Respiratory: Negative for cough and shortness of breath.   Cardiovascular: Negative for chest pain and palpitations.  Genitourinary: Negative for dysuria and hematuria.  Musculoskeletal: Negative for arthralgias and myalgias.  Skin: Negative for pallor and rash.  Neurological: Positive for  tremors and weakness. Negative for headaches.  Hematological: Negative for adenopathy.     Past Medical History: Past Medical History:  Diagnosis Date  . Bilateral cataracts   . Hypercholesteremia   . Hypertension   . IDA (iron deficiency anemia)   . Oropharyngeal dysphagia   . Parkinson disease ([place])   . Primary open angle glaucoma (POAG) of both eyes      Past Surgical History: Past Surgical History:  Procedure Laterality Date  . CATARACT EXTRACTION, BILATERAL       Family History: Family History  Problem Relation Age of Onset  . Stroke Mother   . Diabetes Mother   . Other Father        brain tumor - not sure if is was cancerous    Social History: Social History   Socioeconomic History  . Marital status: Single    Spouse name: Not on file  . Number of children: 1  . Years of education: 87  . Highest education level: High school graduate  Occupational History  . Occupation: Retired  Tobacco Use  . Smoking status: Never Smoker  . Smokeless tobacco: Never Used  Substance and Sexual Activity  . Alcohol use: Not Currently  . Drug use: Never  . Sexual activity: Not on file  Other Topics Concern  . Not on file  Social History Narrative   06/04/20 Lives alone. Caregivers daily   Right-handed.   One cup coffee per day.   Social Determinants of Health   Financial Resource Strain: Not on file  Food Insecurity: Not on file  Transportation Needs: Not on file  Physical Activity: Not  on file  Stress: Not on file  Social Connections: Not on file    Allergies: No Known Allergies  Outpatient Meds: Current Outpatient Medications  Medication Sig Dispense Refill  . atorvastatin (LIPITOR) 40 MG tablet Take 1 tablet by mouth daily.    . carbidopa-levodopa-entacapone (STALEVO 200) 50-200-200 MG tablet Take 1 tablet by mouth 5 (five) times daily. (Patient taking differently: Take 1 tablet by mouth 3 (three) times daily.) 150 tablet 11  . Cholecalciferol (VITAMIN  D3 PO) Take 25 mg by mouth daily.    Marland Kitchen lisinopril-hydrochlorothiazide (ZESTORETIC) 10-12.5 MG tablet Take 1 tablet by mouth daily.    . potassium chloride SA (KLOR-CON) 20 MEQ tablet Take 20 mEq by mouth daily.    . rasagiline (AZILECT) 1 MG TABS tablet Take 1 tablet (1 mg total) by mouth daily. 90 tablet 4   No current facility-administered medications for this visit.    Also on ducosate or ducolax (?) No aspirin or NSAIDs ___________________________________________________________________ Objective   Exam:  BP 120/84   Pulse 80   Ht 5\' 8"  (1.727 m)   Wt 152 lb (68.9 kg)   BMI 23.11 kg/m  Wt Readings from Last 3 Encounters:  11/03/20 152 lb (68.9 kg)  09/02/20 143 lb 8 oz (65.1 kg)  06/04/20 143 lb 8 oz (65.1 kg)  niece present for entire visit   General: Pleasant and attentive. Slow shuffling gait, uses a walker. Masked facies, soft voice, dysarthria. He was able to get on exam table with assistance.  Eyes: sclera anicteric, no redness  ENT: oral mucosa moist without lesions, no cervical or supraclavicular lymphadenopathy  CV: RRR without murmur, S1/S2, no JVD, no peripheral edema  Resp: clear to auscultation bilaterally, normal RR and effort noted  GI: soft, no tenderness, with active bowel sounds. No guarding or palpable organomegaly noted.  Skin; warm and dry, no rash or jaundice noted Rectal: Decreased resting and voluntary sphincter tone. Copious soft stool in rectal vault, faintly heme positive. Soft, enlarged prostate, no rectal mass felt  Labs:  PCP labs from [id number]:  WBC 5.4, hemoglobin 11.3, hematocrit 34.5, MCV 84, RDW normal at 13  Glucose 124, BUN 12, creatinine 0.8 Sodium 138, potassium 3.3, chloride 95, bicarb 26, calcium 9.4, iron normal at 41, 21% saturation, ferritin 430  Radiologic Studies: Modified barium study January 2021:  Pt presents with a mild oropharyngeal dysphagia characterized by lingual pumping in an effort to masticate solids;  reduced pharyngeal clearance of POs due to decreased base of tongue retraction and decreased pharyngeal squeeze; generally reliable laryngeal vestibule closure with only occasional incidents of transient penetration (penetration/aspiration scale score of 2), but no aspiration.  After the study was completed, Mr. Koepp and his niece were educated regarding results; video was reviewed.  We discussed the generalized weakness and slowing of muscle function associated with PD, the benefit of therapy to help stave off further decline, the possibility of aspiration and its effects, and the benefit of ongoing mobility in general.  Recommend Mr. Hoefle continue eating a regular diet and drinking thin liquids.  Meds should be crushed given the potential for lodging in pharynx. He would benefit from dysphagia therapy to address lingual coordination and pharyngeal strengthening.  Pt/niece verbalized understanding.      Assessment: Encounter Diagnoses  Name Primary?  . Chronic constipation Yes  . Heme positive stool   . Iron deficiency anemia due to chronic blood loss   . Oropharyngeal dysphagia     Chronic constipation and  oropharyngeal dysphagia likely related to Parkinson's disease. Heme positive stool, though his last lab work shows a normocytic anemia without iron deficiency.  We discussed the need for endoscopic work-up for the anemia and heme positive stool including EGD and colonoscopy. He and his niece believe he was able to do it with assistance, I think the bowel preparation will be the most challenging part. Fortunately, his primary care practice ([facility]) is typically of great help managing the bowel preparation and procedure transportation. [place] has a home health aide, and his niece says that he will stay with her at times when he has a medical appointment to attend. That assistance should help him manage the successful bowel preparation.  Procedures were discussed along with risks and benefits and  he was agreeable.  The benefits and risks of the planned procedure were described in detail with the patient or (when appropriate) their health care proxy.  Risks were outlined as including, but not limited to, bleeding, infection, perforation, adverse medication reaction leading to cardiac or pulmonary decompensation, pancreatitis (if ERCP).  The limitation of incomplete mucosal visualization was also discussed.  No guarantees or warranties were given.  Patient at increased risk for cardiopulmonary complications of procedure due to medical comorbidities.  We sent him to the lab today for CBC, iron studies, B12 and folate.  If his anemia continues to worsen and his iron levels remain normal and no source found endoscopic work-up, then primary care should consider hematology evaluation.   Thank you for the courtesy of this consult.  Please call me with any questions or concerns.  [name]  CC: Referring provider noted above

## 2020-11-09 ENCOUNTER — Telehealth: Payer: Self-pay | Admitting: Gastroenterology

## 2020-11-09 NOTE — Telephone Encounter (Signed)
Called and spoke to [place]. She requested that records/orders be refaxed to correct fax: [phone] to the Attn of the Dr. Cletis [place].  Faxed.

## 2020-11-09 NOTE — Telephone Encounter (Signed)
Jearld Pies RN from [facility] is requesting orders to be faxed over if possible  Fax: [phone] CB [phone]

## 2020-11-17 ENCOUNTER — Telehealth: Payer: Self-pay

## 2020-11-17 ENCOUNTER — Other Ambulatory Visit: Payer: Self-pay

## 2020-11-17 DIAGNOSIS — K5909 Other constipation: Secondary | ICD-10-CM

## 2020-11-17 DIAGNOSIS — R195 Other fecal abnormalities: Secondary | ICD-10-CM

## 2020-11-17 DIAGNOSIS — D500 Iron deficiency anemia secondary to blood loss (chronic): Secondary | ICD-10-CM

## 2020-11-17 MED ORDER — PLENVU 140 G PO SOLR: 1.0000 | ORAL | 0 refills | Status: DC

## 2020-11-17 NOTE — Telephone Encounter (Signed)
Called and spoke to patient's niece, [name] at [phone].  The patient lives at home by himself but has significant issues with mobility and would not be able to prep at home for the [facility] procedure bc he can't move quickly. She indicated that Orlene Erm, the social worker at [facility] is available to coordinate getting him a bed at [facility] in order to help him prep and to get him to and from [place] on the day of the procedure. I informed her that Dr. Loletha Carrow has an opening on Monday, 12-07-20. [name] said she can accomanpying the patient to the procedure that day. She also indicated that she can pick him up and take him for Covid testing on Thursday, 12-03-20. I called and Left a detailed message for Orlene Erm at [facility] at [phone]. Patient scheduled for Monday, 3-21 at 11:15 am to arrive at 9:45am and Covid test on Thursday, 3-17 at 12:00pm.

## 2020-11-17 NOTE — Telephone Encounter (Signed)
Spoke to [employer] at [place]. She said Marcie Bal will arrange for patient to be admitted to a SNF for the prep.  [facility] is only open during the day.  She indicated I can send the Plenvu script and the prep instructions to her at fax: [phone] or email to [email or web address] and she will provide to the SNF. Instructions and script sent.

## 2020-11-17 NOTE — Telephone Encounter (Signed)
-----   Message from Doran Stabler, MD sent at 11/17/2020 10:04 AM EST ----- Regarding: Procedure scheduling Jan,  Thank you for checking on this.  This patient should have his procedures at [facility], and it is okay with me to go on the March 21 block if [facility] long schedule will accommodate that.  Thanks a lot.  - HD

## 2020-11-17 NOTE — Progress Notes (Signed)
Patient needs ECL at [facility] with Dr. Loletha Carrow for heme + stool and anemia.

## 2020-11-18 NOTE — Telephone Encounter (Signed)
Pt's niece [name] called to advise that she received a call from someone informing that they received pt's prep instructions by mistake. [name] is requesting that we double check fax number with [facility] and make sure that they receive them.

## 2020-11-18 NOTE — Telephone Encounter (Signed)
Called and spoke to patient's niece [name]. Informed her of all the dates and times for Covid testing and the procedure.  She expressed understanding and will be in touch with Dedrica at [facility]. REfaxed the instructions and Plenvu script to [place] at [phone].

## 2020-11-19 ENCOUNTER — Other Ambulatory Visit (HOSPITAL_COMMUNITY): Payer: Self-pay | Admitting: *Deleted

## 2020-11-20 ENCOUNTER — Other Ambulatory Visit: Payer: Self-pay

## 2020-11-20 ENCOUNTER — Encounter (HOSPITAL_COMMUNITY)
Admission: RE | Admit: 2020-11-20 | Discharge: 2020-11-20 | Disposition: A | Discharge status: Home / Self Care | Payer: Medicare (Managed Care) | Source: Ambulatory Visit | Attending: Nurse Practitioner | Admitting: Nurse Practitioner

## 2020-11-20 DIAGNOSIS — R195 Other fecal abnormalities: Secondary | ICD-10-CM | POA: Diagnosis not present

## 2020-11-20 DIAGNOSIS — D509 Iron deficiency anemia, unspecified: Secondary | ICD-10-CM | POA: Insufficient documentation

## 2020-11-20 MED ORDER — SODIUM CHLORIDE 0.9 % IV SOLN
510.0000 mg | INTRAVENOUS | Status: DC
  Administered 2020-11-20: 510 mg via INTRAVENOUS
  Filled 2020-11-20: qty 510

## 2020-11-20 NOTE — Discharge Instructions (Signed)
Ferumoxytol injection What is this medicine? FERUMOXYTOL is an iron complex. Iron is used to make healthy red blood cells, which carry oxygen and nutrients throughout the body. This medicine is used to treat iron deficiency anemia. This medicine may be used for other purposes; ask your health care provider or pharmacist if you have questions. COMMON BRAND NAME(S): Feraheme What should I tell my health care provider before I take this medicine? They need to know if you have any of these conditions:  anemia not caused by low iron levels  high levels of iron in the blood  magnetic resonance imaging (MRI) test scheduled  an unusual or allergic reaction to iron, other medicines, foods, dyes, or preservatives  pregnant or trying to get pregnant  breast-feeding How should I use this medicine? This medicine is for injection into a vein. It is given by a health care professional in a hospital or clinic setting. Talk to your pediatrician regarding the use of this medicine in children. Special care may be needed. Overdosage: If you think you have taken too much of this medicine contact a poison control center or emergency room at once. NOTE: This medicine is only for you. Do not share this medicine with others. What if I miss a dose? It is important not to miss your dose. Call your doctor or health care professional if you are unable to keep an appointment. What may interact with this medicine? This medicine may interact with the following medications:  other iron products This list may not describe all possible interactions. Give your health care provider a list of all the medicines, herbs, non-prescription drugs, or dietary supplements you use. Also tell them if you smoke, drink alcohol, or use illegal drugs. Some items may interact with your medicine. What should I watch for while using this medicine? Visit your doctor or healthcare professional regularly. Tell your doctor or healthcare  professional if your symptoms do not start to get better or if they get worse. You may need blood work done while you are taking this medicine. You may need to follow a special diet. Talk to your doctor. Foods that contain iron include: whole grains/cereals, dried fruits, beans, or peas, leafy green vegetables, and organ meats (liver, kidney). What side effects may I notice from receiving this medicine? Side effects that you should report to your doctor or health care professional as soon as possible:  allergic reactions like skin rash, itching or hives, swelling of the face, lips, or tongue  breathing problems  changes in blood pressure  feeling faint or lightheaded, falls  fever or chills  flushing, sweating, or hot feelings  swelling of the ankles or feet Side effects that usually do not require medical attention (report to your doctor or health care professional if they continue or are bothersome):  diarrhea  headache  nausea, vomiting  stomach pain This list may not describe all possible side effects. Call your doctor for medical advice about side effects. You may report side effects to FDA at 1-800-FDA-1088. Where should I keep my medicine? This drug is given in a hospital or clinic and will not be stored at home. NOTE: This sheet is a summary. It may not cover all possible information. If you have questions about this medicine, talk to your doctor, pharmacist, or health care provider.  2021 Elsevier/Gold Standard (2016-10-24 20:21:10)  

## 2020-11-26 NOTE — Telephone Encounter (Signed)
Called and spoke to [place] at [facility]. She confirmed they received the instructions and Plenvu prescription. She requested that I mail a copy of the instructions to patient's niece, [name].

## 2020-11-27 ENCOUNTER — Encounter (HOSPITAL_COMMUNITY)
Admission: RE | Admit: 2020-11-27 | Discharge: 2020-11-27 | Disposition: A | Discharge status: Home / Self Care | Payer: Medicare (Managed Care) | Source: Ambulatory Visit | Attending: Nurse Practitioner | Admitting: Nurse Practitioner

## 2020-11-27 ENCOUNTER — Other Ambulatory Visit: Payer: Self-pay

## 2020-11-27 DIAGNOSIS — D509 Iron deficiency anemia, unspecified: Secondary | ICD-10-CM | POA: Diagnosis not present

## 2020-11-27 MED ORDER — SODIUM CHLORIDE 0.9 % IV SOLN
510.0000 mg | INTRAVENOUS | Status: DC
  Administered 2020-11-27: 510 mg via INTRAVENOUS
  Filled 2020-11-27: qty 510

## 2020-11-27 NOTE — Progress Notes (Signed)
Attempted to obtain medical history via telephone, unable to reach at this time. I left a voicemail to return pre surgical testing department's phone call.  

## 2020-12-03 ENCOUNTER — Other Ambulatory Visit (HOSPITAL_COMMUNITY)
Admission: RE | Admit: 2020-12-03 | Discharge: 2020-12-03 | Disposition: A | Discharge status: Home / Self Care | Payer: Medicare (Managed Care) | Source: Ambulatory Visit | Attending: Gastroenterology | Admitting: Gastroenterology

## 2020-12-03 DIAGNOSIS — Z20822 Contact with and (suspected) exposure to covid-19: Secondary | ICD-10-CM | POA: Insufficient documentation

## 2020-12-03 DIAGNOSIS — Z01812 Encounter for preprocedural laboratory examination: Secondary | ICD-10-CM | POA: Insufficient documentation

## 2020-12-03 LAB — SARS CORONAVIRUS 2 (TAT 6-24 HRS): SARS Coronavirus 2: NEGATIVE

## 2020-12-07 ENCOUNTER — Ambulatory Visit (HOSPITAL_COMMUNITY)
Admission: RE | Admit: 2020-12-07 | Discharge: 2020-12-07 | Disposition: A | Discharge status: Home / Self Care | Payer: Medicare (Managed Care) | Attending: Gastroenterology | Admitting: Gastroenterology

## 2020-12-07 ENCOUNTER — Encounter (HOSPITAL_COMMUNITY): Payer: Self-pay | Admitting: Gastroenterology

## 2020-12-07 ENCOUNTER — Ambulatory Visit (HOSPITAL_COMMUNITY): Payer: Medicare (Managed Care) | Admitting: Anesthesiology

## 2020-12-07 ENCOUNTER — Encounter (HOSPITAL_COMMUNITY)
Admission: RE | Disposition: A | Discharge status: Home / Self Care | Payer: Self-pay | Source: Home / Self Care | Attending: Gastroenterology

## 2020-12-07 ENCOUNTER — Other Ambulatory Visit: Payer: Self-pay

## 2020-12-07 DIAGNOSIS — D124 Benign neoplasm of descending colon: Secondary | ICD-10-CM | POA: Diagnosis not present

## 2020-12-07 DIAGNOSIS — K229 Disease of esophagus, unspecified: Secondary | ICD-10-CM | POA: Insufficient documentation

## 2020-12-07 DIAGNOSIS — I10 Essential (primary) hypertension: Secondary | ICD-10-CM | POA: Insufficient documentation

## 2020-12-07 DIAGNOSIS — K921 Melena: Secondary | ICD-10-CM | POA: Diagnosis not present

## 2020-12-07 DIAGNOSIS — K2970 Gastritis, unspecified, without bleeding: Secondary | ICD-10-CM | POA: Insufficient documentation

## 2020-12-07 DIAGNOSIS — K2950 Unspecified chronic gastritis without bleeding: Secondary | ICD-10-CM | POA: Insufficient documentation

## 2020-12-07 DIAGNOSIS — G20 Parkinson's disease: Secondary | ICD-10-CM | POA: Insufficient documentation

## 2020-12-07 DIAGNOSIS — K222 Esophageal obstruction: Secondary | ICD-10-CM | POA: Insufficient documentation

## 2020-12-07 DIAGNOSIS — D509 Iron deficiency anemia, unspecified: Secondary | ICD-10-CM | POA: Diagnosis not present

## 2020-12-07 HISTORY — PX: POLYPECTOMY: SHX5525

## 2020-12-07 HISTORY — PX: COLONOSCOPY WITH PROPOFOL: SHX5780

## 2020-12-07 HISTORY — PX: HEMOSTASIS CLIP PLACEMENT: SHX6857

## 2020-12-07 HISTORY — PX: ESOPHAGOGASTRODUODENOSCOPY (EGD) WITH PROPOFOL: SHX5813

## 2020-12-07 HISTORY — PX: BIOPSY: SHX5522

## 2020-12-07 LAB — POCT I-STAT, CHEM 8
BUN: 6 mg/dL — ABNORMAL LOW (ref 8–23)
Calcium, Ion: 1.17 mmol/L (ref 1.15–1.40)
Chloride: 111 mmol/L (ref 98–111)
Creatinine, Ser: 0.7 mg/dL (ref 0.61–1.24)
Glucose, Bld: 78 mg/dL (ref 70–99)
HCT: 49 % (ref 39.0–52.0)
Hemoglobin: 16.7 g/dL (ref 13.0–17.0)
Potassium: 3.9 mmol/L (ref 3.5–5.1)
Sodium: 147 mmol/L — ABNORMAL HIGH (ref 135–145)
TCO2: 22 mmol/L (ref 22–32)

## 2020-12-07 LAB — GLUCOSE, CAPILLARY
Glucose-Capillary: 69 mg/dL — ABNORMAL LOW (ref 70–99)
Glucose-Capillary: 121 mg/dL — ABNORMAL HIGH (ref 70–99)
Glucose-Capillary: 39 mg/dL — CL (ref 70–99)
Glucose-Capillary: 92 mg/dL (ref 70–99)

## 2020-12-07 SURGERY — ESOPHAGOGASTRODUODENOSCOPY (EGD) WITH PROPOFOL
Anesthesia: Monitor Anesthesia Care

## 2020-12-07 MED ORDER — DEXTROSE 50 % IV SOLN
12.5000 g | INTRAVENOUS | Status: AC
  Administered 2020-12-07: 12.5 g via INTRAVENOUS

## 2020-12-07 MED ORDER — PROPOFOL 1000 MG/100ML IV EMUL
INTRAVENOUS | Status: AC
  Filled 2020-12-07: qty 100

## 2020-12-07 MED ORDER — LIDOCAINE HCL 1 % IJ SOLN
INTRAMUSCULAR | Status: DC | PRN
  Administered 2020-12-07: 80 mg via INTRADERMAL

## 2020-12-07 MED ORDER — PROPOFOL 500 MG/50ML IV EMUL
INTRAVENOUS | Status: DC | PRN
  Administered 2020-12-07: 125 ug/kg/min via INTRAVENOUS

## 2020-12-07 MED ORDER — PROPOFOL 10 MG/ML IV BOLUS
INTRAVENOUS | Status: DC | PRN
  Administered 2020-12-07: 20 mg via INTRAVENOUS

## 2020-12-07 MED ORDER — DEXTROSE 50 % IV SOLN
INTRAVENOUS | Status: AC
  Filled 2020-12-07: qty 50

## 2020-12-07 MED ORDER — DEXTROSE 50 % IV SOLN
25.0000 mL | Freq: Once | INTRAVENOUS | Status: AC
  Administered 2020-12-07: 25 mL via INTRAVENOUS

## 2020-12-07 MED ORDER — LACTATED RINGERS IV SOLN
INTRAVENOUS | Status: DC
  Administered 2020-12-07: 1000 mL via INTRAVENOUS

## 2020-12-07 SURGICAL SUPPLY — 24 items

## 2020-12-07 NOTE — Op Note (Signed)
[facility] Patient Name: Eric Bennett Procedure Date: 12/07/2020 MRN: [id number] Attending MD: Estill Cotta. Loletha Carrow , MD Date of Birth: 1946-03-01 CSN: [id number] Age: 75 Admit Type: Inpatient Procedure:                Upper GI endoscopy Indications:              Iron deficiency anemia, Heme positive stool Providers:                Mallie Mussel L. Loletha Carrow, MD, Cleda Daub, RN, Tyna Jaksch Technician Referring MD:             Farrel Gordon, MD ([facility]) Medicines:                Monitored Anesthesia Care Complications:            No immediate complications. Estimated Blood Loss:     Estimated blood loss was minimal. Procedure:                Pre-Anesthesia Assessment:                           - Prior to the procedure, a History and Physical                            was performed, and patient medications and                            allergies were reviewed. The patient's tolerance of                            previous anesthesia was also reviewed. The risks                            and benefits of the procedure and the sedation                            options and risks were discussed with the patient.                            All questions were answered, and informed consent                            was obtained. Prior Anticoagulants: The patient has                            taken no previous anticoagulant or antiplatelet                            agents. ASA Grade Assessment: III - A patient with                            severe systemic disease. After reviewing the risks  and benefits, the patient was deemed in                            satisfactory condition to undergo the procedure.                           After obtaining informed consent, the endoscope was                            passed under direct vision. Throughout the                            procedure, the patient's blood  pressure, pulse, and                            oxygen saturations were monitored continuously. The                            [id number]-[id number] ([id number]) Olympus gastroscope was                            introduced through the mouth, and advanced to the                            second part of duodenum. The upper GI endoscopy was                            accomplished without difficulty. The patient                            tolerated the procedure well. Scope In: Scope Out: Findings:      The larynx was normal.      A widely patent Schatzki ring was found at the gastroesophageal junction.      Diffuse plaques were found in the upper third of the esophagus and in       the middle third of the esophagus.      Diffuse inflammation characterized by adherent blood was found in the       gastric fundus and in the gastric body. Biopsies were taken (antrum and       body) with a cold forceps for histology.      The exam of the stomach was otherwise normal.      The cardia and gastric fundus were normal on retroflexion.      The examined duodenum was normal. Impression:               - Normal larynx.                           - Widely patent Schatzki ring.                           - Esophageal plaques were found, consistent with                            candidiasis.                           -  Gastritis. Biopsied.                           - Normal examined duodenum. Moderate Sedation:      MAC sedation used Recommendation:           - Patient has a contact number available for                            emergencies. The signs and symptoms of potential                            delayed complications were discussed with the                            patient. Return to normal activities tomorrow.                            Written discharge instructions were provided to the                            patient.                           - Resume previous diet.                           -  Continue present medications.                           - Use Prilosec (omeprazole) 20 mg PO daily                            indefinitely.                           - Diflucan (fluconazole) 100 mg PO daily for 3                            weeks. Procedure Code(s):        --- Professional ---                           [phone], Esophagogastroduodenoscopy, flexible,                            transoral; with biopsy, single or multiple Diagnosis Code(s):        --- Professional ---                           K22.2, Esophageal obstruction                           K22.9, Disease of esophagus, unspecified                           K29.70, Gastritis, unspecified, without bleeding  D50.9, Iron deficiency anemia, unspecified                           R19.5, Other fecal abnormalities CPT copyright 2019 American Medical Association. All rights reserved. The codes documented in this report are preliminary and upon coder review may  be revised to meet current compliance requirements. [name], MD 12/07/2020 12:38:36 PM This report has been signed electronically. Number of Addenda: 0

## 2020-12-07 NOTE — Discharge Instructions (Signed)
YOU HAD AN ENDOSCOPIC PROCEDURE TODAY: Refer to the procedure report and other information in the discharge instructions given to you for any specific questions about what was found during the examination. If this information does not answer your questions, please call [place] office at [phone] to clarify.  ° °YOU SHOULD EXPECT: Some feelings of bloating in the abdomen. Passage of more gas than usual. Walking can help get rid of the air that was put into your GI tract during the procedure and reduce the bloating. If you had a lower endoscopy (such as a colonoscopy or flexible sigmoidoscopy) you may notice spotting of blood in your stool or on the toilet paper. Some abdominal soreness may be present for a day or two, also. ° °DIET: Your first meal following the procedure should be a light meal and then it is ok to progress to your normal diet. A half-sandwich or bowl of soup is an example of a good first meal. Heavy or fried foods are harder to digest and may make you feel nauseous or bloated. Drink plenty of fluids but you should avoid alcoholic beverages for 24 hours. If you had a esophageal dilation, please see attached instructions for diet.   ° °ACTIVITY: Your care partner should take you home directly after the procedure. You should plan to take it easy, moving slowly for the rest of the day. You can resume normal activity the day after the procedure however YOU SHOULD NOT DRIVE, use power tools, machinery or perform tasks that involve climbing or major physical exertion for 24 hours (because of the sedation medicines used during the test).  ° °SYMPTOMS TO REPORT IMMEDIATELY: °A gastroenterologist can be reached at any hour. Please call [phone]  for any of the following symptoms:  °Following lower endoscopy (colonoscopy, flexible sigmoidoscopy) °Excessive amounts of blood in the stool  °Significant tenderness, worsening of abdominal pains  °Swelling of the abdomen that is new, acute  °Fever of 100° or  higher  °Following upper endoscopy (EGD, EUS, ERCP, esophageal dilation) °Vomiting of blood or coffee ground material  °New, significant abdominal pain  °New, significant chest pain or pain under the shoulder blades  °Painful or persistently difficult swallowing  °New shortness of breath  °Black, tarry-looking or red, bloody stools ° °FOLLOW UP:  °If any biopsies were taken you will be contacted by phone or by letter within the next 1-3 weeks. Call [phone]  if you have not heard about the biopsies in 3 weeks.  °Please also call with any specific questions about appointments or follow up tests. ° °

## 2020-12-07 NOTE — Transfer of Care (Signed)
Immediate Anesthesia Transfer of Care Note  Patient: Eric Bennett  Procedure(s) Performed: ESOPHAGOGASTRODUODENOSCOPY (EGD) WITH PROPOFOL (N/A ) COLONOSCOPY WITH PROPOFOL (N/A ) BIOPSY POLYPECTOMY HEMOSTASIS CLIP PLACEMENT  Patient Location: PACU and Endoscopy Unit  Anesthesia Type:MAC  Level of Consciousness: oriented, drowsy and patient cooperative  Airway & Oxygen Therapy: Patient Spontanous Breathing and Patient connected to face mask oxygen  Post-op Assessment: Report given to RN and Post -op Vital signs reviewed and stable  Post vital signs: Reviewed and stable  Last Vitals:  Vitals Value Taken Time  BP 131/73 12/07/20 1240  Temp    Pulse    Resp 12 12/07/20 1244  SpO2    Vitals shown include unvalidated device data.  Last Pain:  Vitals:   12/07/20 1013  TempSrc: Temporal  PainSc: 0-No pain         Complications: No complications documented.

## 2020-12-07 NOTE — Anesthesia Postprocedure Evaluation (Signed)
Anesthesia Post Note  Patient: Eric Bennett  Procedure(s) Performed: ESOPHAGOGASTRODUODENOSCOPY (EGD) WITH PROPOFOL (N/A ) COLONOSCOPY WITH PROPOFOL (N/A ) BIOPSY POLYPECTOMY HEMOSTASIS CLIP PLACEMENT     Patient location during evaluation: Endoscopy Anesthesia Type: MAC Level of consciousness: awake Pain management: pain level controlled Vital Signs Assessment: post-procedure vital signs reviewed and stable Respiratory status: spontaneous breathing Cardiovascular status: stable Postop Assessment: no apparent nausea or vomiting Anesthetic complications: no   No complications documented.  Last Vitals:  Vitals:   12/07/20 1240 12/07/20 1300  BP: 131/73 (!) 142/84  Pulse: 74 92  Resp: (!) 8 12  Temp: 36.6 C   SpO2: 100% 92%    Last Pain:  Vitals:   12/07/20 1300  TempSrc:   PainSc: 0-No pain                 Huston Foley

## 2020-12-07 NOTE — Interval H&P Note (Signed)
History and Physical Interval Note:  12/07/2020 11:47 AM  Eric Bennett  has presented today for surgery, with the diagnosis of anemia, heme postitive stool.  The various methods of treatment have been discussed with the patient and family. After consideration of risks, benefits and other options for treatment, the patient has consented to  Procedure(s): ESOPHAGOGASTRODUODENOSCOPY (EGD) WITH PROPOFOL (N/A) COLONOSCOPY WITH PROPOFOL (N/A) as a surgical intervention.  The patient's history has been reviewed, patient examined, no change in status, stable for surgery.  I have reviewed the patient's chart and labs.  Questions were answered to the patient's satisfaction.     [name]

## 2020-12-07 NOTE — H&P (Signed)
History:  This patient presents for endoscopic testing for IDA, Heme positive stool. (See details in note from 11/13/20 office visit).  Antoine Poche Referring physician: Waylan Rocher, MD  Past Medical History: Past Medical History:  Diagnosis Date  . Bilateral cataracts   . Hypercholesteremia   . Hypertension   . IDA (iron deficiency anemia)   . Oropharyngeal dysphagia   . Parkinson disease ([place])   . Primary open angle glaucoma (POAG) of both eyes      Past Surgical History: Past Surgical History:  Procedure Laterality Date  . CATARACT EXTRACTION, BILATERAL      Allergies: No Known Allergies  Outpatient Meds: Current Facility-Administered Medications  Medication Dose Route Frequency Provider Last Rate Last Admin  . lactated ringers infusion   Intravenous Continuous [name], MD 10 mL/hr at 12/07/20 1106 Continued from Pre-op at 12/07/20 1106      ___________________________________________________________________ Objective   Exam:  BP (!) 163/98   Pulse (!) 126   Temp (!) 96.4 F (35.8 C) (Temporal)   Resp (!) 22   Ht 5\' 11"  (1.803 m)   Wt 70.3 kg   SpO2 98%   BMI 21.62 kg/m  Dysarthric (Parkinson's), masked facies  CV: RRR without murmur, S1/S2, no JVD, no peripheral edema  Resp: clear to auscultation bilaterally, normal RR and effort noted  GI: soft, no tenderness, with active bowel sounds. No guarding or palpable organomegaly noted.  Neuro: awake, alert and oriented x 3. Normal gross motor function and fluent speech   Assessment:  IDA, heme positive  Plan:  EGd and colonsocopy   [name]

## 2020-12-07 NOTE — Op Note (Signed)
[facility] Patient Name: Eric Bennett Procedure Date: 12/07/2020 MRN: [id number] Attending MD: Estill Cotta. Loletha Carrow , MD Date of Birth: Jun 04, 1946 CSN: [id number] Age: 75 Admit Type: Inpatient Procedure:                Colonoscopy Indications:              Heme positive stool, Iron deficiency anemia Providers:                Mallie Mussel L. Loletha Carrow, MD, Cleda Daub, RN, Tyna Jaksch Technician Referring MD:             Farrel Gordon, MD ([facility]) Medicines:                Monitored Anesthesia Care Complications:            No immediate complications. Estimated Blood Loss:     Estimated blood loss was minimal. Procedure:                Pre-Anesthesia Assessment:                           - Prior to the procedure, a History and Physical                            was performed, and patient medications and                            allergies were reviewed. The patient's tolerance of                            previous anesthesia was also reviewed. The risks                            and benefits of the procedure and the sedation                            options and risks were discussed with the patient.                            All questions were answered, and informed consent                            was obtained. Prior Anticoagulants: The patient has                            taken no previous anticoagulant or antiplatelet                            agents. ASA Grade Assessment: III - A patient with                            severe systemic disease. After reviewing the risks  and benefits, the patient was deemed in                            satisfactory condition to undergo the procedure.                           - Prior to the procedure, a History and Physical                            was performed, and patient medications and                            allergies were reviewed. The patient's  tolerance of                            previous anesthesia was also reviewed. The risks                            and benefits of the procedure and the sedation                            options and risks were discussed with the patient.                            All questions were answered, and informed consent                            was obtained. Prior Anticoagulants: The patient has                            taken no previous anticoagulant or antiplatelet                            agents. ASA Grade Assessment: III - A patient with                            severe systemic disease. After reviewing the risks                            and benefits, the patient was deemed in                            satisfactory condition to undergo the procedure.                           After obtaining informed consent, the colonoscope                            was passed under direct vision. Throughout the                            procedure, the patient's blood pressure, pulse, and  oxygen saturations were monitored continuously. The                            CF-HQ190L ([id number] Olympus colonoscope was                            introduced through the anus and advanced to the the                            cecum, identified by appendiceal orifice and                            ileocecal valve. The colonoscopy was somewhat                            difficult due to a redundant colon and a tortuous                            colon. The patient tolerated the procedure well.                            The quality of the bowel preparation was good. The                            ileocecal valve, appendiceal orifice, and rectum                            were photographed. Scope In: 12:05:25 PM Scope Out: 12:27:39 PM Scope Withdrawal Time: 0 hours 14 minutes 12 seconds  Total Procedure Duration: 0 hours 22 minutes 14 seconds  Findings:      The perianal and  digital rectal examinations were normal.      A 6-8 mm polyp was found in the descending colon. The polyp was sessile.       The polyp was removed with a cold snare. Resection and retrieval were       complete. To stop active bleeding, one hemostatic clip was successfully       placed (MR conditional). There was no bleeding at the end of the       maneuver.      The exam was otherwise without abnormality on direct and retroflexion       views. Impression:               - One 6-8 mm polyp in the descending colon, removed                            with a cold snare. Resected and retrieved. Clip (MR                            conditional) was placed.                           - The examination was otherwise normal on direct  and retroflexion views. Moderate Sedation:      MAC sedation used Recommendation:           - Patient has a contact number available for                            emergencies. The signs and symptoms of potential                            delayed complications were discussed with the                            patient. Return to normal activities tomorrow.                            Written discharge instructions were provided to the                            patient.                           - Resume previous diet.                           - Continue present medications.                           - Await pathology results.                           - No repeat routine surveillance colonoscopy                            recommended due to age, overall medical condition                            and current guidelines. Procedure Code(s):        --- Professional ---                           [phone], Colonoscopy, flexible; with removal of                            tumor(s), polyp(s), or other lesion(s) by snare                            technique Diagnosis Code(s):        --- Professional ---                           K63.5, Polyp of  colon                           R19.5, Other fecal abnormalities                           D50.9, Iron deficiency anemia, unspecified CPT copyright 2019 American Medical Association. All rights reserved. The  codes documented in this report are preliminary and upon coder review may  be revised to meet current compliance requirements. Klinton Candelas L. Loletha Carrow, MD 12/07/2020 12:42:36 PM This report has been signed electronically. Number of Addenda: 0

## 2020-12-07 NOTE — Anesthesia Preprocedure Evaluation (Addendum)
Anesthesia Evaluation  Patient identified by MRN, date of birth, ID band Patient awake    Reviewed: Allergy & Precautions, NPO status , Patient's Chart, lab work & pertinent test results  Airway Mallampati: I       Dental  (+) Missing, Edentulous Upper,    Pulmonary neg pulmonary ROS,    Pulmonary exam normal        Cardiovascular hypertension, Pt. on medications Normal cardiovascular exam     Neuro/Psych  Neuromuscular disease    GI/Hepatic Neg liver ROS,   Endo/Other  negative endocrine ROS  Renal/GU negative Renal ROS  negative genitourinary   Musculoskeletal negative musculoskeletal ROS (+)   Abdominal Normal abdominal exam  (+)   Peds  Hematology   Anesthesia Other Findings   Reproductive/Obstetrics                            Anesthesia Physical Anesthesia Plan  ASA: III  Anesthesia Plan: MAC   Post-op Pain Management:    Induction:   PONV Risk Score and Plan: Propofol infusion  Airway Management Planned: Natural Airway and Mask  Additional Equipment: None  Intra-op Plan:   Post-operative Plan:   Informed Consent: I have reviewed the patients History and Physical, chart, labs and discussed the procedure including the risks, benefits and alternatives for the proposed anesthesia with the patient or authorized representative who has indicated his/her understanding and acceptance.     Dental advisory given  Plan Discussed with: CRNA  Anesthesia Plan Comments:         Anesthesia Quick Evaluation

## 2020-12-08 ENCOUNTER — Encounter: Payer: Self-pay | Admitting: Gastroenterology

## 2020-12-08 ENCOUNTER — Telehealth: Payer: Self-pay

## 2020-12-08 LAB — SURGICAL PATHOLOGY

## 2020-12-08 NOTE — Telephone Encounter (Signed)
-----   Message from [place], MD sent at 12/07/2020  3:42 PM EDT ----- Please see my EGD report and communicate with this patient's [facility] program to be sure they prescribe the omeprazole and fluconazole for him.  I believe that practice typically takes care of its own prescriptions.  Copies of the procedure reports were sent with him back to [facility] today.  Please be sure they were received and send them if not.  - HD  - HD

## 2020-12-08 NOTE — Telephone Encounter (Signed)
Called [facility] at [phone], I left a voicemail for clinical nurse Carmell Austria to return call to discuss prescriptions that were recommended by Dr. Loletha Carrow and to confirm that they have received both procedure reports.

## 2020-12-09 ENCOUNTER — Encounter (HOSPITAL_COMMUNITY): Payer: Self-pay | Admitting: Gastroenterology

## 2020-12-09 NOTE — Telephone Encounter (Signed)
Spoke with Carmell Austria, clinical nurse at [facility]. She asked that we fax the report and requested prescriptions to [phone]. Records have been faxed.

## 2020-12-30 ENCOUNTER — Encounter: Payer: Medicare (Managed Care) | Admitting: Gastroenterology

## 2021-03-04 ENCOUNTER — Ambulatory Visit (INDEPENDENT_AMBULATORY_CARE_PROVIDER_SITE_OTHER): Payer: Medicare (Managed Care) | Admitting: Neurology

## 2021-03-04 ENCOUNTER — Encounter: Payer: Self-pay | Admitting: Neurology

## 2021-03-04 VITALS — BP 136/84 | HR 100 | Ht 71.0 in | Wt 150.0 lb

## 2021-03-04 DIAGNOSIS — G20 Parkinson's disease: Secondary | ICD-10-CM | POA: Diagnosis not present

## 2021-03-04 NOTE — Patient Instructions (Signed)
Continue current medications You seem to be doing well  See you back in 6 months

## 2021-03-04 NOTE — Progress Notes (Signed)
PATIENT: Eric Bennett DOB: [birth year]  Chief Complaint  Patient presents with   Follow-up    New rm niece Eric Bennett, states he is doing well, no new concerns      HISTORICAL  Eric Bennett is a 75 year old male, seen in request by his primary care physician Dr. Oris Drone, Telford Nab A, accompanied by his niece Eric Bennett for evaluation of Parkinson's disease, initial evaluation was on October 17, 2019.  I have reviewed and summarized the referring note from the referring physician.  He has past medical history of hypertension, hyperlipidemia,  He was diagnosed with Parkinson's disease around 2013, presented with gait abnormality, has been treated with titrating dose of Sinemet, has been on current dose 25/250 mg 4 times a day at 6, 10, 2, 6 PM per patient over the past few years,  Despite titrating dose of levodopa, he had a gradual decline of his functional status, increased gait abnormality, rely on his walker, also have frequent freezing, difficulty initiate gait, fell multiple times,  He has slow worsening hypersialorrhea, wet slurred speech,  I reviewed the last neurology visit by Dr. Everette Rank on August 26, 2019, increase the Sinemet  to 25/250 mg every 4 hours instead of 5 hours interval, also recommended speech and dysphagia evaluation, he is also taking pramipexole extended release 1.5 mg at bedtime, entacapone 200 mg 3 times a day  MRI of the brain without contrast in February 2018: No evidence of acute abnormality, mild generalized atrophy,  UPDATE December 19 2019: Following last visit in January 2021, I have changed him to Stalevo 200 5 times a day at 35, 9, 12, 15, 18, (from previous Sinemet 25/250 mg 4 times a day, entacapone 200 mg 3 times a day), is also taking extended release pramipexole 1.5 mg at bedtime, he can move much better, much less frequent freezing spells,   UPDATE Sept 16 2021: He is accompanied by his niece Eric Bennett at today's visit, will check on him on a daily basis,  he also has caregiver with him majority of the day, he still lives by himself,  He was noted to have significant improvement for a while with change of his Parkinson medications, to Stalevo 200 5 times a day, but it is difficult for him to get 5 doses in due to his participate in [facility] program, he is now only take 4 times a day,  There was acute change few weeks ago, he become quiet with increased gait abnormality, hard of hearing, increased memory loss, rely on his niece to provide most the history,  UPDATE Sep 02 2020: He is accompanied by his niece Eric Bennett at today's visit, overall doing very well, only taking Stalevo 200 3 times daily,  I personally reviewed MRI in October 2021, mild generalized atrophy, supratentorium small vessel disease, no acute abnormality, 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,  Patient has urinary frequency, ambulate okay with walker, has caregiver 6 hours each day, his niece Eric Bennett check on him daily  Update March 04, 2021 SS: Here today with his niece, [facility] program could not get Stalevo, so medications are broken down, currently taking: Entacapone 200 mg TID, carbidopa levodopa 50-200 mg 3 times daily, Azilect 1 mg daily.  Takes carbidopa at 8, 2, 8 PM (taking 4-5 times a day was too much).  Doing well, denies any freezing episodes.  There is no tremor.  Continues to live in a senior living apartment, has an aide 5 days a week, his niece Eric Bennett checks on  him daily.  Has had a few falls from not using his walker.  Has an upright Rollator at home. [facility] program has been great for him, goes on Tuesdays for the day program.  Sleeping well, eating well.  Denies drooling. Cystic lesion popped on his neck randomly.   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  There is no definite cord compression.  There is  incidental large cystic lesion noted in the posterior neck skin soft tissue at C3-5  REVIEW OF SYSTEMS: Full 14 system review of systems performed and notable only for as above  See HPI  ALLERGIES: No Known Allergies  HOME MEDICATIONS: Current Outpatient Medications  Medication Sig Dispense Refill   atorvastatin (LIPITOR) 40 MG tablet Take 40 mg by mouth at bedtime.     bimatoprost (LUMIGAN) 0.01 % SOLN Place 1 drop into both eyes at bedtime.     brimonidine (ALPHAGAN) 0.2 % ophthalmic solution Place 1 drop into both eyes 2 (two) times daily.     carbidopa-levodopa (SINEMET CR) 50-200 MG tablet Take 1 tablet by mouth 3 (three) times daily.     cholecalciferol (VITAMIN D3) 25 MCG (1000 UNIT) tablet Take 1,000 Units by mouth daily.     dorzolamide-timolol (COSOPT) 22.3-6.8 MG/ML ophthalmic solution Place 1 drop into both eyes 2 (two) times daily.     entacapone (COMTAN) 200 MG tablet Take 200 mg by mouth 3 (three) times daily.     lisinopril-hydrochlorothiazide (ZESTORETIC) 10-12.5 MG tablet Take 1 tablet by mouth daily.     omeprazole (PRILOSEC) 20 MG capsule Take 20 mg by mouth daily.     PEG-KCl-NaCl-NaSulf-Na Asc-C (PLENVU) 140 g SOLR Take 1 kit by mouth as directed. Manufacturer's coupon Universal coupon code:BIN: P2366821; GROUP: [id number]; PCN: [id number]; ID: [id number]; PAY NO MORE $50; NO prior authorization 1 each 0   potassium chloride SA (KLOR-CON) 20 MEQ tablet Take 20 mEq by mouth daily.     rasagiline (AZILECT) 1 MG TABS tablet Take 1 tablet (1 mg total) by mouth daily. 90 tablet 4   No current facility-administered medications for this visit.    PAST MEDICAL HISTORY: Past Medical History:  Diagnosis Date   Bilateral cataracts    Hypercholesteremia    Hypertension    IDA (iron deficiency anemia)    Oropharyngeal dysphagia    Parkinson disease ([place])    Primary open angle glaucoma (POAG) of both eyes     PAST SURGICAL HISTORY: Past Surgical History:  Procedure  Laterality Date   BIOPSY  12/07/2020   Procedure: BIOPSY;  Surgeon: Doran Stabler, MD;  Location: [facility];  Service: Gastroenterology;;   CATARACT EXTRACTION, BILATERAL     COLONOSCOPY WITH PROPOFOL N/A 12/07/2020   Procedure: COLONOSCOPY WITH PROPOFOL;  Surgeon: Doran Stabler, MD;  Location: [facility];  Service: Gastroenterology;  Laterality: N/A;   ESOPHAGOGASTRODUODENOSCOPY (EGD) WITH PROPOFOL N/A 12/07/2020   Procedure: ESOPHAGOGASTRODUODENOSCOPY (EGD) WITH PROPOFOL;  Surgeon: Doran Stabler, MD;  Location: [facility];  Service: Gastroenterology;  Laterality: N/A;   HEMOSTASIS CLIP PLACEMENT  12/07/2020   Procedure: HEMOSTASIS CLIP PLACEMENT;  Surgeon: Doran Stabler, MD;  Location: [facility];  Service: Gastroenterology;;   POLYPECTOMY  12/07/2020   Procedure: POLYPECTOMY;  Surgeon: Doran Stabler, MD;  Location: [facility];  Service: Gastroenterology;;    FAMILY HISTORY: Family History  Problem  Relation Age of Onset   Stroke Mother    Diabetes Mother    Other Father        brain tumor - not sure if is was cancerous    SOCIAL HISTORY: Social History   Socioeconomic History   Marital status: Single    Spouse name: Not on file   Number of children: 1   Years of education: 12   Highest education level: High school graduate  Occupational History   Occupation: Retired  Tobacco Use   Smoking status: Never   Smokeless tobacco: Never  Substance and Sexual Activity   Alcohol use: Not Currently   Drug use: Never   Sexual activity: Not on file  Other Topics Concern   Not on file  Social History Narrative   06/04/20 Lives alone. Caregivers daily   Right-handed.   One cup coffee per day.   Social Determinants of Health   Financial Resource Strain: Not on file  Food Insecurity: Not on file  Transportation Needs: Not on file  Physical Activity: Not on file  Stress: Not on file  Social Connections: Not on file  Intimate Partner Violence: Not  on file   PHYSICAL EXAM   Vitals:   03/04/21 1056  BP: 136/84  Pulse: 100  Weight: 150 lb (68 kg)  Height: $Remove'5\' 11"'[id number]$  (1.803 m)    Not recorded     Body mass index is 20.92 kg/m.  PHYSICAL EXAMNIATION: MENTAL STATUS: MMSE - Mini Mental State Exam 06/04/2020  Orientation to time 1  Orientation to Place 2  Registration 3  Attention/ Calculation 3  Recall 1  Language- name 2 objects 2  Language- repeat 1  Language- follow 3 step command 3  Language- read & follow direction 1  Write a sentence 0  Copy design 0  Total score 17   Physical Exam  General: The patient is alert and cooperative at the time of the examination.  Skin: No significant peripheral edema is noted.  Neurologic Exam  Mental status: The patient is alert and oriented x 3 at the time of the examination.  History is mostly provided by his niece, but he is participatory. Smiling, pleasant.  Follows commands well.  Speech is soft, stuttering.  Cranial nerves: Facial symmetry is present. Extraocular movements are full. Visual fields are full.  Motor: The patient has good strength in all 4 extremities, mild rigidity upper extremity more than left, mild bradykinesia, no tremor or weakness  Sensory examination: Soft touch sensation is symmetric on the face, arms, and legs.  Coordination: The patient has good finger-nose-finger bilaterally  Gait and station: Has to push off from seated position, relies on his walker, but has a good pace, stride, steady with turns. Able to take himself to bathroom independently  Reflexes: Deep tendon reflexes are symmetric but decreased throughout  DIAGNOSTIC DATA (LABS, IMAGING, TESTING) - I reviewed patient records, labs, notes, testing and imaging myself where available.  ASSESSMENT AND PLAN  Eric Bennett is a 75 y.o. male    Idiopathic Parkinson's disease 2.   Memory loss -Doing well today, no freezing issues, benefiting from [facility] program, doing well independent  living, with caregiver, niece checking on him  -[facility] could not get Stalevo, will continue current medications:  Azilect 1 mg daily  Sinemet 50-200 mg, 1 three times daily  Entacapone 200 mg 3 times daily -Continue safe ambulation with walker, upright rollator -Follow-up in 6 months or sooner if needed  3 . Cervical Stenosis -MRI  cervical spine showed degenerative/arthritic changes most prominent at C3-4 with mild canal and bilateral foraminal narrowing, no definite cord compression, incidental soft tissue swelling in the neck skin (this burst on its own)  I spent 32 minutes of face-to-face and non-face-to-face time with patient.  This included previsit chart review, lab review, study review, order entry, examination, reviewing medications, management, and follow-up.  Eric Dakin, Eric Bennett  [facility] [address] [place], [place] [address] [phone]

## 2021-09-20 NOTE — Progress Notes (Signed)
PATIENT: Davontay Watlington DOB: Aug 15, 1946  Chief Complaint  Patient presents with   Follow-up    Rm 15. Accompanied by niece. Pt's niece says his symptoms are worse since last visit. Denies tremors. Pt struggles with incontinence. Pt has been having many recent falls, most recently this morning. C/o memory issues (MMSE 18/30).    HISTORICAL  Franky Reier is a 76 year old male, seen in request by his primary care physician Dr. Oris Drone, T[place] Nab A, accompanied by [name] for evaluation of Parkinson's disease, initial evaluation was on October 17, 2019.  I have reviewed and summarized the referring note from the referring physician.  He has past medical history of hypertension, hyperlipidemia,  He was diagnosed with Parkinson's disease around 2013, presented with gait abnormality, has been treated with titrating dose of Sinemet, has been on current dose 25/250 mg 4 times a day at 6, 10, 2, 6 PM per patient over the past few years,  Despite titrating dose of levodopa, he had a gradual decline of his functional status, increased gait abnormality, rely on his walker, also have frequent freezing, difficulty initiate gait, fell multiple times,  He has slow worsening hypersialorrhea, wet slurred speech,  I reviewed the last neurology visit by Dr. Everette Rank on August 26, 2019, increase the Sinemet  to 25/250 mg every 4 hours instead of 5 hours interval, also recommended speech and dysphagia evaluation, he is also taking pramipexole extended release 1.5 mg at bedtime, entacapone 200 mg 3 times a day  MRI of the brain without contrast in February 2018: No evidence of acute abnormality, mild generalized atrophy,  UPDATE December 19 2019: Following last visit in January 2021, I have changed him to Stalevo 200 5 times a day at 81, 9, 12, 15, 18, (from previous Sinemet 25/250 mg 4 times a day, entacapone 200 mg 3 times a day), is also taking extended release pramipexole 1.5 mg at bedtime, he can  move much better, much less frequent freezing spells,   UPDATE Sept 16 2021: He is accompanied by [name] at today's visit, will check on him on a daily basis, he also has caregiver with him majority of the day, he still lives by himself,  He was noted to have significant improvement for a while with change of his Parkinson medications, to Stalevo 200 5 times a day, but it is difficult for him to get 5 doses in due to his participate in [facility], he is now only take 4 times a day,  There was acute change few weeks ago, he become quiet with increased gait abnormality, hard of hearing, increased memory loss, rely on his niece to provide most the history,  UPDATE Sep 02 2020: He is accompanied by [name] at today's visit, overall doing very well, only taking Stalevo 200 3 times daily,  I personally reviewed MRI in October 2021, mild generalized atrophy, supratentorium small vessel disease, no acute abnormality, 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,  Patient has urinary frequency, ambulate okay with walker, has caregiver 6 hours each day, [name] check on him daily  Update March 04, 2021 SS: Here today with his niece, [facility] could not get Stalevo, so medications are broken down, currently taking: Entacapone 200 mg TID, carbidopa levodopa 50-200 mg 3 times daily, Azilect 1 mg daily.  Takes carbidopa at 8, 2, 8 PM (taking 4-5 times a day was too much).  Doing well, denies any freezing episodes.  There  is no tremor.  Continues to live in a senior living apartment, has an aide 5 days a week, [name] checks on him daily.  Has had a few falls from not using his walker.  Has an upright Rollator at home. [facility] program has been great for him, goes on Tuesdays for the day program.  Sleeping well, eating well.  Denies drooling. Cystic lesion popped on his neck randomly.   IMPRESSION: Abnormal MRI scan cervical spine without contrast showing  prominent spondylitic change from C3-C7 most prominent at at C3-4 with resulting in mild canal and bilateral foraminal narrowing.  There is no definite cord compression.  There is incidental large cystic lesion noted in the posterior neck skin soft tissue at C3-5  Update September 21, 2021 SS: Here today with niece, [name]. In ER 08/19/21 for failure to thrive, dehydration. Recommended SNF. He went home to independent living, with aide. Doesn't have much appetite. Aide comes few hours a day most days. Tues and wed he goes to [facility], get PT there. [name] is working to get more help. Several falls, not using the walker, forgets. Having urinary incontinence. CT head showed chronic changes, nothing acute. Denies choking or trouble swallowing. Took medication 1.5 hours ago. [name] thinks medication is working well. Weight is stable.  REVIEW OF SYSTEMS: Full 14 system review of systems performed and notable only for as above  See HPI  ALLERGIES: No Known Allergies  HOME MEDICATIONS: Current Outpatient Medications  Medication Sig Dispense Refill   atorvastatin (LIPITOR) 40 MG tablet Take 40 mg by mouth at bedtime.     bimatoprost (LUMIGAN) 0.01 % SOLN Place 1 drop into both eyes at bedtime.     brimonidine (ALPHAGAN) 0.2 % ophthalmic solution Place 1 drop into both eyes 2 (two) times daily.     carbidopa-levodopa (SINEMET CR) 50-200 MG tablet Take 1 tablet by mouth 3 (three) times daily.     cholecalciferol (VITAMIN D3) 25 MCG (1000 UNIT) tablet Take 1,000 Units by mouth daily.     cyanocobalamin 1000 MCG tablet Take 1 tablet by mouth daily.     dorzolamide-timolol (COSOPT) 22.3-6.8 MG/ML ophthalmic solution Place 1 drop into both eyes 2 (two) times daily.     entacapone (COMTAN) 200 MG tablet Take 200 mg by mouth 3 (three) times daily.     lisinopril-hydrochlorothiazide (ZESTORETIC) 10-12.5 MG tablet Take 1 tablet by mouth as needed.     omeprazole (PRILOSEC) 20 MG capsule Take 20 mg by mouth daily.      PEG-KCl-NaCl-NaSulf-Na Asc-C (PLENVU) 140 g SOLR Take 1 kit by mouth as directed. Manufacturer's coupon Universal coupon code:BIN: P2366821; GROUP: [id number]; PCN: [id number]; ID: [id number]; PAY NO MORE $50; NO prior authorization 1 each 0   potassium chloride SA (KLOR-CON) 20 MEQ tablet Take 20 mEq by mouth daily.     rasagiline (AZILECT) 1 MG TABS tablet Take 1 tablet (1 mg total) by mouth daily. 90 tablet 4   No current facility-administered medications for this visit.    PAST MEDICAL HISTORY: Past Medical History:  Diagnosis Date   Bilateral cataracts    Hypercholesteremia    Hypertension    IDA (iron deficiency anemia)    Oropharyngeal dysphagia    Parkinson disease ([place])    Primary open angle glaucoma (POAG) of both eyes     PAST SURGICAL HISTORY: Past Surgical History:  Procedure Laterality Date   BIOPSY  12/07/2020   Procedure: BIOPSY;  Surgeon: Doran Stabler, MD;  Location:  [facility];  Service: Gastroenterology;;   CATARACT EXTRACTION, BILATERAL     COLONOSCOPY WITH PROPOFOL N/A 12/07/2020   Procedure: COLONOSCOPY WITH PROPOFOL;  Surgeon: Doran Stabler, MD;  Location: [facility];  Service: Gastroenterology;  Laterality: N/A;   ESOPHAGOGASTRODUODENOSCOPY (EGD) WITH PROPOFOL N/A 12/07/2020   Procedure: ESOPHAGOGASTRODUODENOSCOPY (EGD) WITH PROPOFOL;  Surgeon: Doran Stabler, MD;  Location: [facility];  Service: Gastroenterology;  Laterality: N/A;   HEMOSTASIS CLIP PLACEMENT  12/07/2020   Procedure: HEMOSTASIS CLIP PLACEMENT;  Surgeon: Doran Stabler, MD;  Location: [facility];  Service: Gastroenterology;;   POLYPECTOMY  12/07/2020   Procedure: POLYPECTOMY;  Surgeon: Doran Stabler, MD;  Location: [facility];  Service: Gastroenterology;;    FAMILY HISTORY: Family History  Problem Relation Age of Onset   Stroke Mother    Diabetes Mother    Other Father        brain tumor - not sure if is was cancerous    SOCIAL HISTORY: Social History    Socioeconomic History   Marital status: Single    Spouse name: Not on file   Number of children: 1   Years of education: 12   Highest education level: High school graduate  Occupational History   Occupation: Retired  Tobacco Use   Smoking status: Never   Smokeless tobacco: Never  Substance and Sexual Activity   Alcohol use: Not Currently   Drug use: Never   Sexual activity: Not on file  Other Topics Concern   Not on file  Social History Narrative   06/04/20 Lives alone. Caregivers daily   Right-handed.   One cup coffee per day.   Social Determinants of Health   Financial Resource Strain: Not on file  Food Insecurity: Not on file  Transportation Needs: Not on file  Physical Activity: Not on file  Stress: Not on file  Social Connections: Not on file  Intimate Partner Violence: Not on file   PHYSICAL EXAM   Vitals:   09/21/21 0955  BP: (!) 147/87  Pulse: 68  Weight: 144 lb 8 oz (65.5 kg)  Height: 5' 11" (1.803 m)     Not recorded     Body mass index is 20.15 kg/m.  PHYSICAL EXAMNIATION: MENTAL STATUS: MMSE - Mini Mental State Exam 09/21/2021 06/04/2020  Orientation to time 1 1  Orientation to Place 4 2  Registration 3 3  Attention/ Calculation 0 3  Recall 3 1  Language- name 2 objects 2 2  Language- repeat 1 1  Language- follow 3 step command 3 3  Language- read & follow direction 1 1  Write a sentence 0 0  Write a sentence-comments unable to write -  Copy design 0 0  Copy design-comments unable to write -  Total score 18 17   Physical Exam  General: The patient is alert and cooperative at the time of the examination.  Skin: No significant peripheral edema is noted.  Neurologic Exam  Mental status: The patient is alert and oriented x 3 at the time of the examination.  History is mostly provided by his niece. Smiling, pleasant.  Follows commands well.  Speech is soft, some stuttering. Squints his eyes, left more than right.   Cranial nerves:  Facial symmetry is present. Extraocular movements are full. Visual fields are full.  Motor: The patient has good strength in all 4 extremities, mild bradykinesia, no tremor noted. With standing, stands with fair ease, but is cautious  Sensory examination: Soft touch  sensation is symmetric on the face, arms, and legs.  Coordination: The patient has good finger-nose-finger bilaterally  Gait and station: Has to push off from seated position, relies on his walker, but has a good pace, stride, steady with turns, picks up walker with turns  Reflexes: Deep tendon reflexes are symmetric but decreased throughout  DIAGNOSTIC DATA (LABS, IMAGING, TESTING) - I reviewed patient records, labs, notes, testing and imaging myself where available.  ASSESSMENT AND PLAN  Ramadan Couey is a 76 y.o. male    Idiopathic Parkinson's disease 2.   Memory loss -Having more difficulty living on his own, not eating, more frequent falls -Would recommend a higher level of care for safety and supervision, to include PT/OT/ST, his niece is working with [facility] on this, will forward my notes over to them -For now will continue current medications, there doesn't seem to be a wearing off effect of the Sinemet affecting his mobility currently   Azilect  1 mg daily  Sinemet 50-200 mg, 1 three times daily  Entacapone 200 mg 3 times daily -Return here in 5 months to see Dr. Krista Blue, encouraged niece to call with any updates or concerns  3 . Cervical Stenosis -MRI cervical spine showed degenerative/arthritic changes most prominent at C3-4 with mild canal and bilateral foraminal narrowing, no definite cord compression, incidental soft tissue swelling in the neck skin (this burst on its own)  Evangeline Dakin, DNP  [facility] [address] [place], [place] [address] [phone]

## 2021-09-21 ENCOUNTER — Ambulatory Visit (INDEPENDENT_AMBULATORY_CARE_PROVIDER_SITE_OTHER): Payer: Medicare (Managed Care) | Admitting: Neurology

## 2021-09-21 ENCOUNTER — Encounter: Payer: Self-pay | Admitting: Neurology

## 2021-09-21 VITALS — BP 147/87 | HR 68 | Ht 71.0 in | Wt 144.5 lb

## 2021-09-21 DIAGNOSIS — R413 Other amnesia: Secondary | ICD-10-CM | POA: Diagnosis not present

## 2021-09-21 DIAGNOSIS — G20 Parkinson's disease: Secondary | ICD-10-CM | POA: Diagnosis not present

## 2021-09-21 NOTE — Patient Instructions (Signed)
Would recommend higher level of care for supervision and safety, as well as physical therapy, occupational therapy, and speech therapy Keep the same medications for now Need to be very careful not to fall  See you back in 5 months

## 2021-09-22 NOTE — Progress Notes (Signed)
Chart reviewed, agree above plan ?

## 2021-09-27 ENCOUNTER — Ambulatory Visit: Payer: Medicare (Managed Care) | Admitting: Neurology

## 2021-10-15 IMAGING — MR MR HEAD W/O CM
10 series · 48 of 48 positions shown · non-contrast
Comparison: MR head without contrast 01/26/2017

CLINICAL DATA: Parkinson's disease.  Progressive symptoms.

EXAM:
MRI HEAD WITHOUT CONTRAST
TECHNIQUE: Multiplanar, multiecho pulse sequences of the brain and surrounding
structures were obtained without intravenous contrast.

[Series 2: T1 · sagittal · 5.0mm · 0.45mm/px · 3 of 21 slices shown]
[im 1/21]
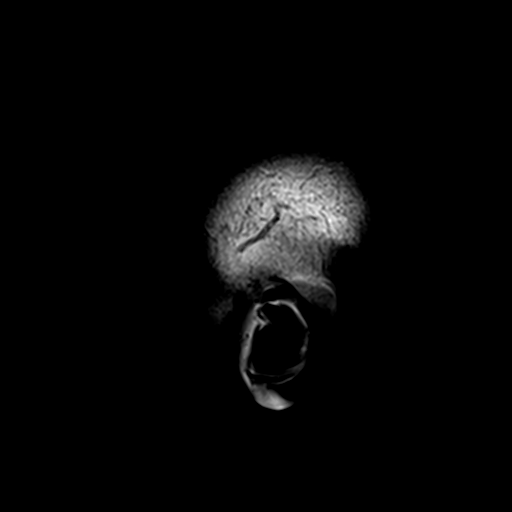
[im 11/21]
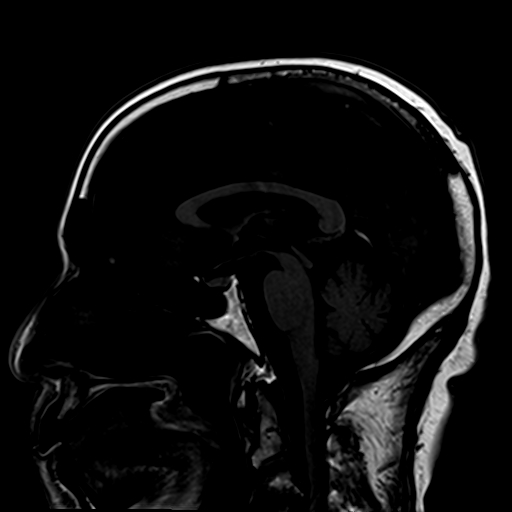
[im 21/21]
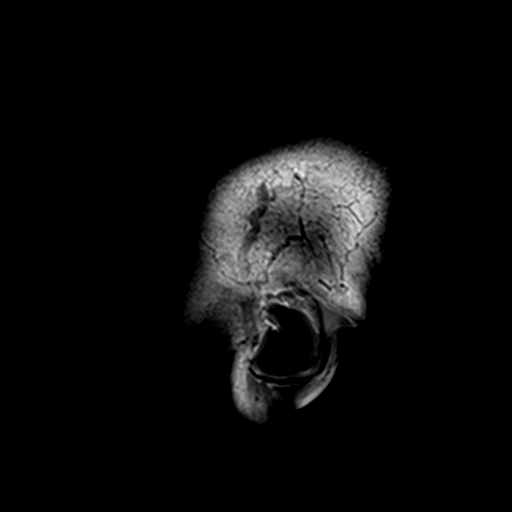

[Series 3: DWI · axial · 3.0mm · 1.80mm/px · z∈[-41,+106]mm · 9 of 100 slices shown (1 of 4)]
[im 1/100]
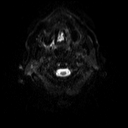
[im 13/100]
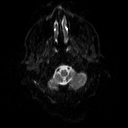
[im 25/100]
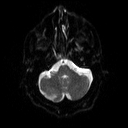
[im 38/100]
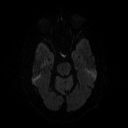
[im 50/100]
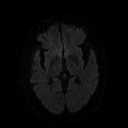
[im 62/100]
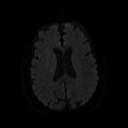
[im 75/100]
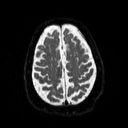
[im 87/100]
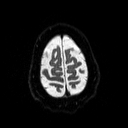
[im 100/100]
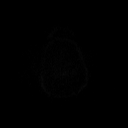

[Series 4: DWI · axial · 3.0mm · 1.80mm/px · z∈[-41,+106]mm · 4 of 46 slices shown (2 of 4)]
[im 1/46]
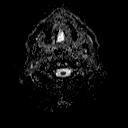
[im 16/46]
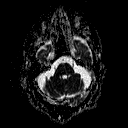
[im 31/46]
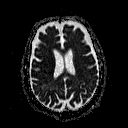
[im 46/46]
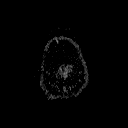

[Series 5: DWI · coronal · 5.0mm · 1.80mm/px · 6 of 70 slices shown (3 of 4)]
[im 1/70]
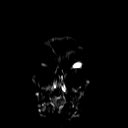
[im 14/70]
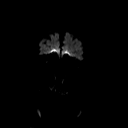
[im 28/70]
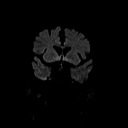
[im 42/70]
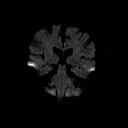
[im 56/70]
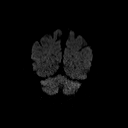
[im 70/70]
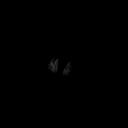

[Series 6: DWI · coronal · 5.0mm · 1.80mm/px · 3 of 35 slices shown (4 of 4)]
[im 1/35]
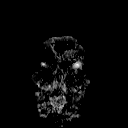
[im 18/35]
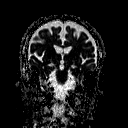
[im 35/35]
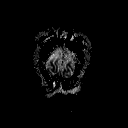

[Series 7: T2 · axial · 5.0mm · 0.72mm/px · z∈[-42,+105]mm · 2 of 22 slices shown (1 of 2)]
[im 1/22]
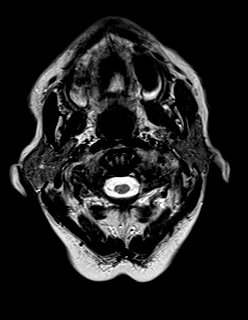
[im 22/22]
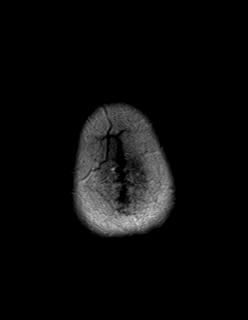

[Series 8: FLAIR · axial · 3.0mm · 0.45mm/px · z∈[-35,+100]mm · 3 of 30 slices shown]
[im 1/30]
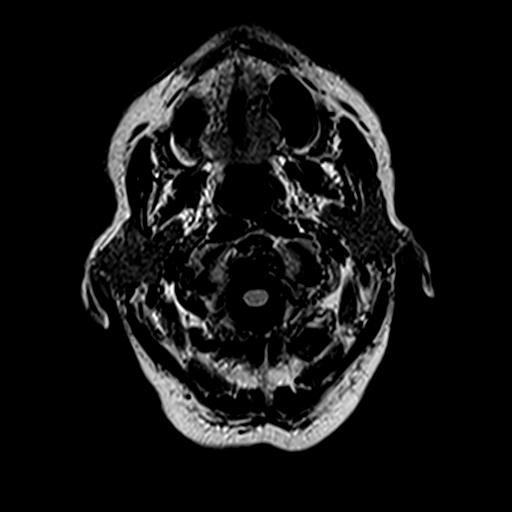
[im 15/30]
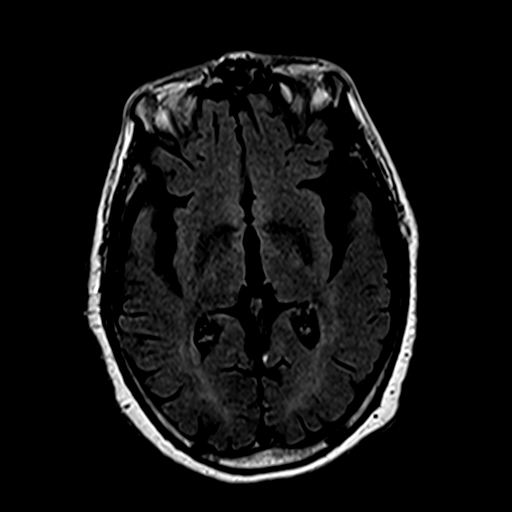
[im 30/30]
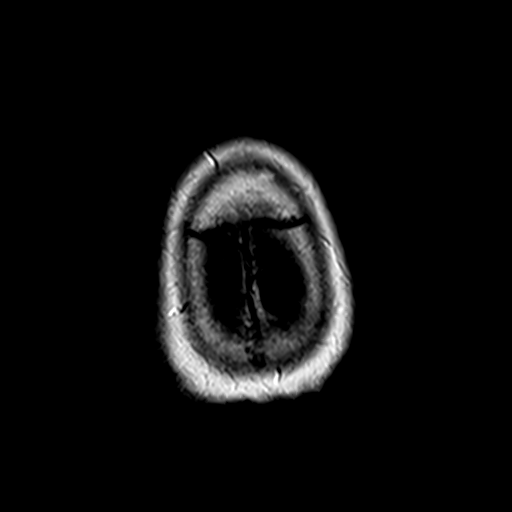

[Series 10: swi_images · axial · 4.0mm · 0.90mm/px · z∈[-37,+102]mm · 3 of 36 slices shown]
[im 1/36]
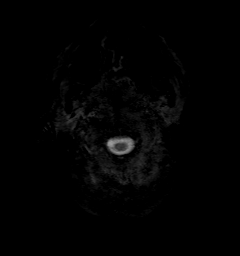
[im 18/36]
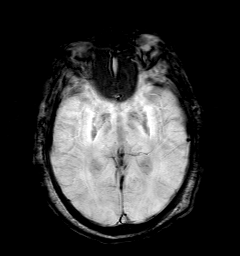
[im 36/36]
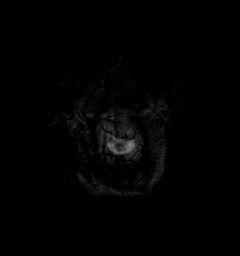

[Series 11: t1_mpr_tra · axial · 1.0mm · 0.71mm/px · z∈[-40,+103]mm · 13 of 144 slices shown]
[im 1/144]
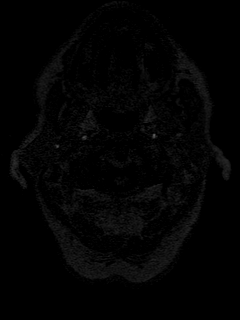
[im 12/144]
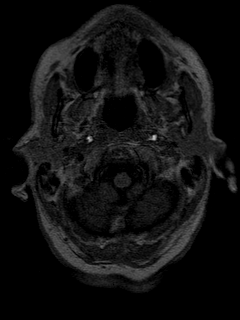
[im 24/144]
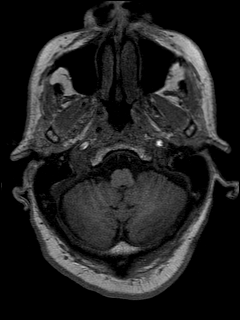
[im 36/144]
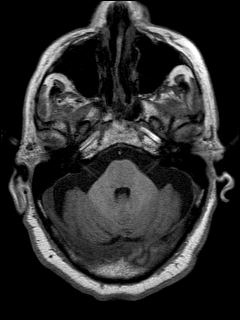
[im 48/144]
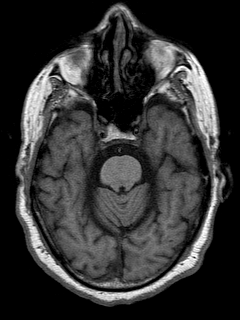
[im 60/144]
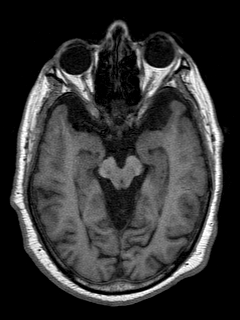
[im 72/144]
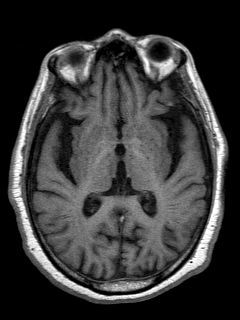
[im 84/144]
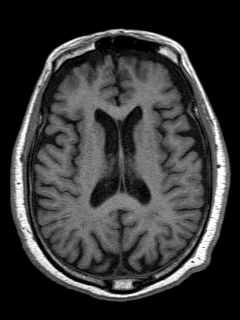
[im 96/144]
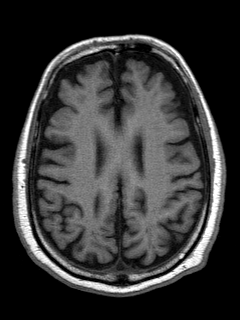
[im 108/144]
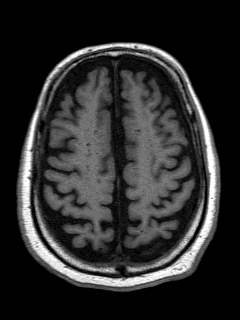
[im 120/144]
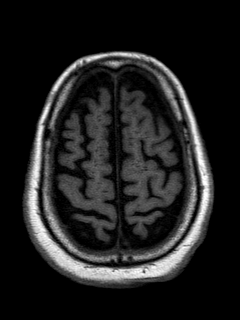
[im 132/144]
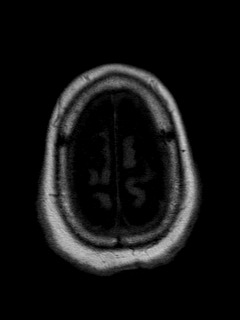
[im 144/144]
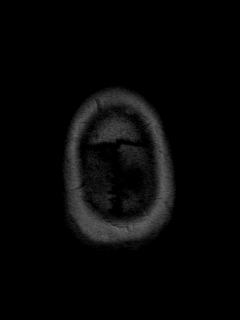

[Series 12: T2 · coronal · 5.0mm · 0.45mm/px · 2 of 26 slices shown (2 of 2)]
[im 1/26]
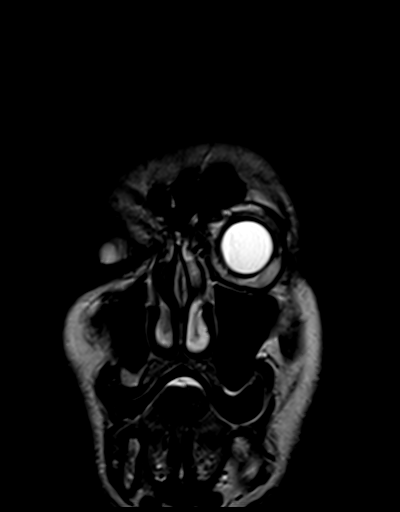
[im 26/26]
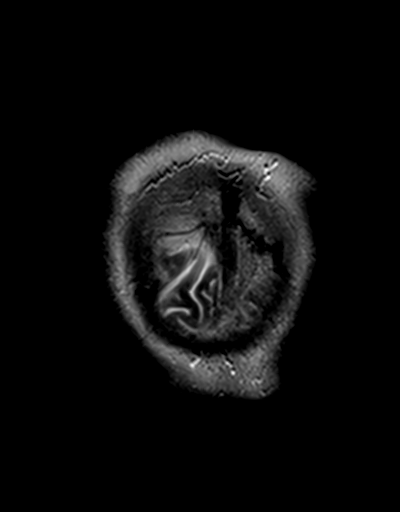

[48 of 48 positions shown; findings below may reference images not displayed]

FINDINGS: Brain: No acute infarct, hemorrhage, or mass lesion is present. Mild
generalized atrophy and white matter changes are likely within
normal limits for age. The ventricles are proportionate to the
degree of atrophy. Mild prominence of subarachnoid spaces is noted.
No significant extra-axial fluid collection is present.

The internal auditory canals are within normal limits. The brainstem
and cerebellum are within normal limits.

Vascular: Flow is present in the major intracranial arteries.

Skull and upper cervical spine: The craniocervical junction is
normal. Advanced degenerative changes are present at C3-4 with focal
central cervical stenosis. Chronic endplate changes are present at
C3-4. Marrow signal is otherwise normal.

Sinuses/Orbits: The paranasal sinuses and mastoid air cells are
clear. Bilateral lens replacements are noted. Globes and orbits are
otherwise unremarkable.
IMPRESSION: 1. Normal MRI appearance of the brain for age.
2. Advanced degenerative changes at C3-4 with focal central cervical
stenosis.

## 2022-02-21 ENCOUNTER — Encounter: Payer: Self-pay | Admitting: Neurology

## 2022-02-21 ENCOUNTER — Ambulatory Visit (INDEPENDENT_AMBULATORY_CARE_PROVIDER_SITE_OTHER): Payer: Medicare (Managed Care) | Admitting: Neurology

## 2022-02-21 VITALS — BP 142/83 | HR 76 | Ht 71.0 in | Wt 150.5 lb

## 2022-02-21 DIAGNOSIS — R413 Other amnesia: Secondary | ICD-10-CM

## 2022-02-21 DIAGNOSIS — R269 Unspecified abnormalities of gait and mobility: Secondary | ICD-10-CM

## 2022-02-21 DIAGNOSIS — G20 Parkinson's disease: Secondary | ICD-10-CM | POA: Diagnosis not present

## 2022-02-21 MED ORDER — CARBIDOPA-LEVODOPA ER 50-200 MG PO TBCR: EXTENDED_RELEASE_TABLET | ORAL | 3 refills | Status: DC

## 2022-02-21 MED ORDER — RASAGILINE MESYLATE 1 MG PO TABS: 1.0000 mg | ORAL_TABLET | Freq: Every day | ORAL | 4 refills | Status: DC

## 2022-02-21 NOTE — Progress Notes (Signed)
PATIENT: Eric Bennett DOB: 1946-01-18  Chief Complaint  Patient presents with   Follow-up    Rm 15. Accompanied by niece, Eric Bennett. Pt states he is stable. Has reduced carbidopa-levodopa 50-200 mg to BID.    HISTORICAL  Eric Bennett is a 76 year old male, seen in request by his primary care physician Dr. Oris Drone, [place] Nab A, accompanied by his niece Eric Bennett for evaluation of Parkinson's disease, initial evaluation was on October 17, 2019.  I have reviewed and summarized the referring note from the referring physician.  He has past medical history of hypertension, hyperlipidemia,  He was diagnosed with Parkinson's disease around 2013, presented with gait abnormality, has been treated with titrating dose of Sinemet, has been on current dose 25/250 mg 4 times a day at 6, 10, 2, 6 PM per patient over the past few years,  Despite titrating dose of levodopa, he had a gradual decline of his functional status, increased gait abnormality, rely on his walker, also have frequent freezing, difficulty initiate gait, fell multiple times,  He has slow worsening hypersialorrhea, wet slurred speech,  I reviewed the last neurology visit by Dr. Everette Rank on August 26, 2019, increase the Sinemet  to 25/250 mg every 4 hours instead of 5 hours interval, also recommended speech and dysphagia evaluation, he is also taking pramipexole extended release 1.5 mg at bedtime, entacapone 200 mg 3 times a day  MRI of the brain without contrast in February 2018: No evidence of acute abnormality, mild generalized atrophy,  UPDATE December 19 2019: Following last visit in January 2021, I have changed him to Stalevo 200 5 times a day at 6, 9, 12, 15, 18, (from previous Sinemet 25/250 mg 4 times a day, entacapone 200 mg 3 times a day), is also taking extended release pramipexole 1.5 mg at bedtime, he can move much better, much less frequent freezing spells,   UPDATE Sept 16 2021: He is accompanied by his niece Eric Bennett at  today's visit, will check on him on a daily basis, he also has caregiver with him majority of the day, he still lives by himself,  He was noted to have significant improvement for a while with change of his Parkinson medications, to Stalevo 200 5 times a day, but it is difficult for him to get 5 doses in due to his participate in pace program, he is now only take 4 times a day,  There was acute change few weeks ago, he become quiet with increased gait abnormality, hard of hearing, increased memory loss, rely on his niece to provide most the history,  UPDATE Sep 02 2020: He is accompanied by his niece Eric Bennett at today's visit, overall doing very well, only taking Stalevo 200 3 times daily,  I personally reviewed MRI in October 2021, mild generalized atrophy, supratentorium small vessel disease, no acute abnormality, 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,  Patient has urinary frequency, ambulate okay with walker, has caregiver 6 hours each day, his niece Eric Bennett check on him   UPDATE February 22 2022: He is accompanied by his niece Eric Bennett at today's visit, he still lives alone at home, attempt pace program couple times each week, rest of the time he has caregiver 5 hours each day,  He is currently taking carbidopa extended release 50/200 mg twice a day at 8 AM, 7 PM, Eric Bennett reported lower dose seems to help him better, he is more alert, previously he was on 3 times a day,  He sleeps well, when the  caregiver, he does get opportunity to walk around the neighborhood some, his older sister, Eric Bennett's mother is currently at nursing home for advanced dementia    REVIEW OF SYSTEMS: Full 14 system review of systems performed and notable only for as above  See HPI  ALLERGIES: No Known Allergies  HOME MEDICATIONS: Current Outpatient Medications  Medication Sig Dispense Refill   atorvastatin (LIPITOR) 40 MG tablet Take 40 mg by mouth at bedtime.     bimatoprost (LUMIGAN) 0.01 % SOLN  Place 1 drop into both eyes at bedtime.     brimonidine (ALPHAGAN) 0.2 % ophthalmic solution Place 1 drop into both eyes 2 (two) times daily.     carbidopa-levodopa (SINEMET CR) 50-200 MG tablet Take 1 tablet by mouth 2 (two) times daily.     cholecalciferol (VITAMIN D3) 25 MCG (1000 UNIT) tablet Take 1,000 Units by mouth daily.     dorzolamide-timolol (COSOPT) 22.3-6.8 MG/ML ophthalmic solution Place 1 drop into both eyes 2 (two) times daily.     entacapone (COMTAN) 200 MG tablet Take 200 mg by mouth 3 (three) times daily.     lisinopril-hydrochlorothiazide (ZESTORETIC) 10-12.5 MG tablet Take 1 tablet by mouth as needed.     omeprazole (PRILOSEC) 20 MG capsule Take 20 mg by mouth daily.     PEG-KCl-NaCl-NaSulf-Na Asc-C (PLENVU) 140 g SOLR Take 1 kit by mouth as directed. Manufacturer's coupon Universal coupon code:BIN: [id number]; GROUP: [id number]; PCN: [id number]; ID: [id number]; PAY NO MORE $50; NO prior authorization 1 each 0   potassium chloride SA (KLOR-CON) 20 MEQ tablet Take 20 mEq by mouth daily.     rasagiline (AZILECT) 1 MG TABS tablet Take 1 tablet (1 mg total) by mouth daily. 90 tablet 4   No current facility-administered medications for this visit.    PAST MEDICAL HISTORY: Past Medical History:  Diagnosis Date   Bilateral cataracts    Hypercholesteremia    Hypertension    IDA (iron deficiency anemia)    Oropharyngeal dysphagia    Parkinson disease ([place])    Primary open angle glaucoma (POAG) of both eyes     PAST SURGICAL HISTORY: Past Surgical History:  Procedure Laterality Date   BIOPSY  12/07/2020   Procedure: BIOPSY;  Surgeon: Doran Stabler, MD;  Location: [facility];  Service: Gastroenterology;;   CATARACT EXTRACTION, BILATERAL     COLONOSCOPY WITH PROPOFOL N/A 12/07/2020   Procedure: COLONOSCOPY WITH PROPOFOL;  Surgeon: Doran Stabler, MD;  Location: [facility];  Service: Gastroenterology;  Laterality: N/A;   ESOPHAGOGASTRODUODENOSCOPY (EGD) WITH PROPOFOL N/A  12/07/2020   Procedure: ESOPHAGOGASTRODUODENOSCOPY (EGD) WITH PROPOFOL;  Surgeon: Doran Stabler, MD;  Location: [facility];  Service: Gastroenterology;  Laterality: N/A;   HEMOSTASIS CLIP PLACEMENT  12/07/2020   Procedure: HEMOSTASIS CLIP PLACEMENT;  Surgeon: Doran Stabler, MD;  Location: [facility];  Service: Gastroenterology;;   POLYPECTOMY  12/07/2020   Procedure: POLYPECTOMY;  Surgeon: Doran Stabler, MD;  [facility]: [facility];  Service: Gastroenterology;;    FAMILY HISTORY: Family History  Problem Relation Age of Onset   Stroke Mother    Diabetes Mother    Other Father        brain tumor - not sure if is was cancerous    SOCIAL HISTORY: Social History   Socioeconomic History   Marital status: Single    Spouse name: Not on file   Number of children: 1   Years of education: 12   Highest education level: High  school graduate  Occupational History   Occupation: Retired  Tobacco Use   Smoking status: Never   Smokeless tobacco: Never  Substance and Sexual Activity   Alcohol use: Not Currently   Drug use: Never   Sexual activity: Not on file  Other Topics Concern   Not on file  Social History Narrative   06/04/20 Lives alone. Caregivers daily   Right-handed.   One cup coffee per day.   Social Determinants of Health   Financial Resource Strain: Not on file  Food Insecurity: Not on file  Transportation Needs: Not on file  Physical Activity: Not on file  Stress: Not on file  Social Connections: Not on file  Intimate Partner Violence: Not on file   PHYSICAL EXAM   Vitals:   02/21/22 1038  BP: (!) 142/83  Pulse: 76  Weight: 150 lb 8 oz (68.3 kg)  Height: $Remove'5\' 11"'BqpVBxp$  (1.803 m)     Body mass index is 20.99 kg/m.  PHYSICAL EXAMNIATION: MENTAL STATUS:Awake, soft wet voice, oriented to year, but not his age, or month,    09/21/2021   10:05 AM 06/04/2020    9:00 AM  MMSE - Mini Mental State Exam  Orientation to time 1 1  Orientation to Place 4 2   Registration 3 3  Attention/ Calculation 0 3  Recall 3 1  Language- name 2 objects 2 2  Language- repeat 1 1  Language- follow 3 step command 3 3  Language- read & follow direction 1 1  Write a sentence 0 0  Write a sentence-comments unable to write   Copy design 0 0  Copy design-comments unable to write   Total score 18 17    Cranial nerves: Facial symmetry is present. Extraocular movements are full.  Tends to have his eyes closed  Motor: No weakness, right more than left rigidity, bradykinesia  Sensory examination: Intact to light touch  Reflexes: Hypoactive and symmetric  Coordination: No truncal ataxia, no limb dysmetria  Gait and station: Has to push off from seated position, freezing, moderate stride, en bloc turning,  DIAGNOSTIC DATA (LABS, IMAGING, TESTING) - I reviewed patient records, labs, notes, testing and imaging myself where available.  ASSESSMENT AND PLAN  Jaccob Czaplicki is a 76 y.o. male   Idiopathic Parkinson's disease Dementia  Complains of side effect with Comtane, will stop it, he is only taking Sinemet extended release 50/200 mg 1 tablet twice a day, niece reported he is doing better on the regimen, will moved to time to 8 AM, 2 PM, keep Azilect 1 mg every night  Referral to home physical therapy,  Return to clinic in 6 months with nurse practitioner  If he has significant fluctuation in his motor function, may consider Rytary   Marcial Pacas, M.D. Ph.D.  [facility] [place], [place] [address] Phone: [phone] Fax:      [phone]

## 2022-02-21 NOTE — Patient Instructions (Addendum)
Stop Comtan,  Take Sinemet CR 50-'200mg'$  at 8am, 2pm.  Home health

## 2022-03-02 ENCOUNTER — Telehealth: Payer: Self-pay | Admitting: Neurology

## 2022-03-02 NOTE — Telephone Encounter (Signed)
I spoke with Mariann Laster from [facility] and faxed her over the home health referral and they will get the patient set up.

## 2022-03-02 NOTE — Telephone Encounter (Signed)
I left a voice mail with [facility] to call me back to let me know what to do to get the patient set up with Home Health.

## 2022-08-23 ENCOUNTER — Ambulatory Visit: Payer: Medicare (Managed Care) | Admitting: Neurology

## 2022-08-24 ENCOUNTER — Ambulatory Visit: Payer: Medicare (Managed Care) | Admitting: Neurology

## 2022-09-01 ENCOUNTER — Ambulatory Visit: Payer: Medicare (Managed Care) | Admitting: Neurology

## 2022-09-28 ENCOUNTER — Ambulatory Visit: Payer: Medicare (Managed Care) | Admitting: Neurology

## 2022-11-16 NOTE — Progress Notes (Signed)
PATIENT: Eric Bennett DOB: [birth year]  No chief complaint on file.   HISTORICAL  Eric Bennett is Bennett 77 year old male, seen in request by his primary care physician Dr. Oris Bennett, Eric Bennett, accompanied by his niece Eric Bennett for evaluation of Parkinson's disease, initial evaluation was on October 17, 2019.  I have reviewed and summarized the referring note from the referring physician.  He has past medical history of hypertension, hyperlipidemia,  He was diagnosed with Parkinson's disease around 2013, presented with gait abnormality, has been treated with titrating dose of Sinemet, has been on current dose 25/250 mg 4 times Bennett day at 6, 10, 2, 6 PM per patient over the past few years,  Despite titrating dose of levodopa, he had Bennett gradual decline of his functional status, increased gait abnormality, rely on his walker, also have frequent freezing, difficulty initiate gait, fell multiple times,  He has slow worsening hypersialorrhea, wet slurred speech,  I reviewed the last neurology visit by Dr. Everette Bennett on August 26, 2019, increase the Sinemet  to 25/250 mg every 4 hours instead of 5 hours interval, also recommended speech and dysphagia evaluation, he is also taking pramipexole extended release 1.5 mg at bedtime, entacapone 200 mg 3 times Bennett day  MRI of the brain without contrast in February 2018: No evidence of acute abnormality, mild generalized atrophy,  UPDATE December 19 2019: Following last visit in January 2021, I have changed him to Stalevo 200 5 times Bennett day at 6, 9, 12, 15, 18, (from previous Sinemet 25/250 mg 4 times Bennett day, entacapone 200 mg 3 times Bennett day), is also taking extended release pramipexole 1.5 mg at bedtime, he can move much better, much less frequent freezing spells,   UPDATE Sept 16 2021: He is accompanied by his niece Eric Bennett at today's visit, will check on him on Bennett daily basis, he also has caregiver with him majority of the day, he still lives by himself,  He was noted  to have significant improvement for Bennett while with change of his Parkinson medications, to Stalevo 200 5 times Bennett day, but it is difficult for him to get 5 doses in due to his participate in pace program, he is now only take 4 times Bennett day,  There was acute change few weeks ago, he become quiet with increased gait abnormality, hard of hearing, increased memory loss, rely on his niece to provide most the history,  UPDATE Sep 02 2020: He is accompanied by his niece Eric Bennett at today's visit, overall doing very well, only taking Stalevo 200 3 times daily,  I personally reviewed MRI in October 2021, mild generalized atrophy, supratentorium small vessel disease, no acute abnormality, 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,  Patient has urinary frequency, ambulate okay with walker, has caregiver 6 hours each day, his niece Eric Bennett check on him   UPDATE February 22 2022: He is accompanied by his niece Eric Bennett at today's visit, he still lives alone at home, attempt pace program couple times each week, rest of the time he has caregiver 5 hours each day,  He is currently taking carbidopa extended release 50/200 mg twice Bennett day at 8 AM, 7 PM, Eric Bennett reported lower dose seems to help him better, he is more alert, previously he was on 3 times Bennett day,  He sleeps well, when the caregiver, he does get opportunity to walk around the neighborhood some, his older sister, Eric Bennett's mother is currently at nursing home for advanced dementia  Update  November 17, 2022 SS:     REVIEW OF SYSTEMS: Full 14 system review of systems performed and notable only for as above  See HPI  ALLERGIES: No Known Allergies  HOME MEDICATIONS: Current Outpatient Medications  Medication Sig Dispense Refill   atorvastatin (LIPITOR) 40 MG tablet Take 40 mg by mouth at bedtime.     bimatoprost (LUMIGAN) 0.01 % SOLN Place 1 drop into both eyes at bedtime.     brimonidine (ALPHAGAN) 0.2 % ophthalmic solution Place 1 drop into both  eyes 2 (two) times daily.     carbidopa-levodopa (SINEMET CR) 50-200 MG tablet At 8am, 2pm 180 tablet 3   cholecalciferol (VITAMIN D3) 25 MCG (1000 UNIT) tablet Take 1,000 Units by mouth daily.     dorzolamide-timolol (COSOPT) 22.3-6.8 MG/ML ophthalmic solution Place 1 drop into both eyes 2 (two) times daily.     lisinopril-hydrochlorothiazide (ZESTORETIC) 10-12.5 MG tablet Take 1 tablet by mouth as needed.     omeprazole (PRILOSEC) 20 MG capsule Take 20 mg by mouth daily.     PEG-KCl-NaCl-NaSulf-Na Asc-C (PLENVU) 140 g SOLR Take 1 kit by mouth as directed. Manufacturer's coupon Universal coupon code:BIN: [id number]; GROUPSE:[id number]; PCN: [id number]; IDCY:[id number]; PAY NO MORE $50; NO prior authorization 1 each 0   potassium chloride SA (KLOR-CON) 20 MEQ tablet Take 20 mEq by mouth daily.     rasagiline (AZILECT) 1 MG TABS tablet Take 1 tablet (1 mg total) by mouth daily. 90 tablet 4   No current facility-administered medications for this visit.    PAST MEDICAL HISTORY: Past Medical History:  Diagnosis Date   Bilateral cataracts    Hypercholesteremia    Hypertension    IDA (iron deficiency anemia)    Oropharyngeal dysphagia    Parkinson disease ([place])    Primary open angle glaucoma (POAG) of both eyes     PAST SURGICAL HISTORY: Past Surgical History:  Procedure Laterality Date   BIOPSY  12/07/2020   Procedure: BIOPSY;  Surgeon: Doran Stabler, MD;  Location: [facility];  Service: Gastroenterology;;   CATARACT EXTRACTION, BILATERAL     COLONOSCOPY WITH PROPOFOL N/Bennett 12/07/2020   Procedure: COLONOSCOPY WITH PROPOFOL;  Surgeon: Doran Stabler, MD;  Location: [facility];  Service: Gastroenterology;  Laterality: N/Bennett;   ESOPHAGOGASTRODUODENOSCOPY (EGD) WITH PROPOFOL N/Bennett 12/07/2020   Procedure: ESOPHAGOGASTRODUODENOSCOPY (EGD) WITH PROPOFOL;  Surgeon: Doran Stabler, MD;  Location: [facility];  Service: Gastroenterology;  Laterality: N/Bennett;   HEMOSTASIS CLIP PLACEMENT  12/07/2020    Procedure: HEMOSTASIS CLIP PLACEMENT;  Surgeon: Doran Stabler, MD;  [facility]: [facility];  Service: Gastroenterology;;   POLYPECTOMY  12/07/2020   Procedure: POLYPECTOMY;  Surgeon: Doran Stabler, MD;  [facility];  Service: Gastroenterology;;    FAMILY HISTORY: Family History  Problem Relation Age of Onset   Stroke Mother    Diabetes Mother    Other Father        brain tumor - not sure if is was cancerous    SOCIAL HISTORY: Social History   Socioeconomic History   Marital status: Single    Spouse name: Not on file   Number of children: 1   Years of education: 12   Highest education level: High school graduate  Occupational History   Occupation: Retired  Tobacco Use   Smoking status: Never   Smokeless tobacco: Never  Substance and Sexual Activity   Alcohol use: Not Currently   Drug use: Never   Sexual activity:  Not on file  Other Topics Concern   Not on file  Social History Narrative   06/04/20 Lives alone. Caregivers daily   Right-handed.   One cup coffee per day.   Social Determinants of Health   Financial Resource Strain: Not on file  Food Insecurity: Not on file  Transportation Needs: Not on file  Physical Activity: Not on file  Stress: Not on file  Social Connections: Not on file  Intimate Partner Violence: Not on file   PHYSICAL EXAM   There were no vitals filed for this visit.    There is no height or weight on file to calculate BMI.  PHYSICAL EXAMNIATION: MENTAL STATUS:Awake, soft wet voice, oriented to year, but not his age, or month,    09/21/2021   10:05 AM 06/04/2020    9:00 AM  MMSE - Mini Mental State Exam  Orientation to time 1 1  Orientation to Place 4 2  Registration 3 3  Attention/ Calculation 0 3  Recall 3 1  Language- name 2 objects 2 2  Language- repeat 1 1  Language- follow 3 step command 3 3  Language- read & follow direction 1 1  Write Bennett sentence 0 0  Write Bennett sentence-comments unable to write   Copy  design 0 0  Copy design-comments unable to write   Total score 18 17    Cranial nerves: Facial symmetry is present. Extraocular movements are full.  Tends to have his eyes closed  Motor: No weakness, right more than left rigidity, bradykinesia  Sensory examination: Intact to light touch  Reflexes: Hypoactive and symmetric  Coordination: No truncal ataxia, no limb dysmetria  Gait and station: Has to push off from seated position, freezing, moderate stride, en bloc turning,  DIAGNOSTIC DATA (LABS, IMAGING, TESTING) - I reviewed patient records, labs, notes, testing and imaging myself where available.  ASSESSMENT AND PLAN  Eric Bennett is Bennett 77 y.o. male   Idiopathic Parkinson's disease Dementia  Complains of side effect with Comtane, will stop it, he is only taking Sinemet extended release 50/200 mg 1 tablet twice Bennett day, niece reported he is doing better on the regimen, will moved to time to 8 AM, 2 PM, keep Azilect 1 mg every night  Referral to home physical therapy,  Return to clinic in 6 months with nurse practitioner  If he has significant fluctuation in his motor function, may consider Rytary    Evangeline Dakin, DNP  [facility] [address] [place], [place] [address] [phone]

## 2022-11-17 ENCOUNTER — Encounter: Payer: Self-pay | Admitting: Neurology

## 2022-11-17 ENCOUNTER — Ambulatory Visit (INDEPENDENT_AMBULATORY_CARE_PROVIDER_SITE_OTHER): Payer: Medicare (Managed Care) | Admitting: Neurology

## 2022-11-17 VITALS — BP 146/83 | HR 79 | Ht 67.0 in | Wt 140.0 lb

## 2022-11-17 DIAGNOSIS — G20A1 Parkinson's disease without dyskinesia, without mention of fluctuations: Secondary | ICD-10-CM | POA: Diagnosis not present

## 2022-11-17 DIAGNOSIS — R269 Unspecified abnormalities of gait and mobility: Secondary | ICD-10-CM

## 2022-11-17 NOTE — Patient Instructions (Signed)
He looks overall stable, in fact better I think from the past Continue Azilect, Sinemet  Continue PT See you back in 6 months

## 2023-05-18 ENCOUNTER — Ambulatory Visit: Payer: Medicare (Managed Care) | Admitting: Neurology

## 2023-05-18 ENCOUNTER — Encounter: Payer: Self-pay | Admitting: Neurology

## 2023-05-18 VITALS — BP 120/74 | HR 91 | Resp 16 | Ht 68.0 in

## 2023-05-18 DIAGNOSIS — R413 Other amnesia: Secondary | ICD-10-CM

## 2023-05-18 DIAGNOSIS — G20A1 Parkinson's disease without dyskinesia, without mention of fluctuations: Secondary | ICD-10-CM

## 2023-05-18 DIAGNOSIS — R269 Unspecified abnormalities of gait and mobility: Secondary | ICD-10-CM

## 2023-05-18 NOTE — Progress Notes (Signed)
ASSESSMENT AND PLAN  Eric Bennett is a 77 y.o. male   Idiopathic Parkinson's disease Dementia  He was noted to have increased gait difficulty, most related to lower dose of Sinemet, was only giving once a day, will go back to previous dosage CR 50/200 mg twice a day, at 9 AM, 3 PM, keep Azilect 1 mg daily  Also ordered physical therapy  Hypersialorrhea  He already developed mild swallowing difficulty, I suggested [name] sugarless lollipop to stimulate swallowing reflex, if that does not work, may consider glycopyrrolate 1 mg twice daily as needed, even Botox injection   DIAGNOSTIC DATA (LABS, IMAGING, TESTING) - I reviewed patient records, labs, notes, testing and imaging myself where available.  HISTORICAL  Eric Bennett is a 77 year old male, seen in request by his primary care physician Dr. Sherrin Daisy, Glynda Jaeger A, accompanied by his niece [name] for evaluation of Parkinson's disease, initial evaluation was on October 17, 2019.  I have reviewed and summarized the referring note from the referring physician.  He has past medical history of hypertension, hyperlipidemia,  He was diagnosed with Parkinson's disease around 2013, presented with gait abnormality, has been treated with titrating dose of Sinemet, has been on current dose 25/250 mg 4 times a day at 6, 10, 2, 6 PM per patient over the past few years,  Despite titrating dose of levodopa, he had a gradual decline of his functional status, increased gait abnormality, rely on his walker, also have frequent freezing, difficulty initiate gait, fell multiple times,  He has slow worsening hypersialorrhea, wet slurred speech,  I reviewed the last neurology visit by Dr. Antonietta Barcelona on August 26, 2019, increase the Sinemet  to 25/250 mg every 4 hours instead of 5 hours interval, also recommended speech and dysphagia evaluation, he is also taking pramipexole extended release 1.5 mg at bedtime, entacapone 200 mg 3 times a day  MRI of the  brain without contrast in February 2018: No evidence of acute abnormality, mild generalized atrophy,  UPDATE December 19 2019: Following last visit in January 2021, I have changed him to Stalevo 200 5 times a day at 64, 9, 12, 15, 18, (from previous Sinemet 25/250 mg 4 times a day, entacapone 200 mg 3 times a day), is also taking extended release pramipexole 1.5 mg at bedtime, he can move much better, much less frequent freezing spells,   UPDATE Sept 16 2021: He is accompanied by his niece [name] at today's visit, will check on him on a daily basis, he also has caregiver with him majority of the day, he still lives by himself,  He was noted to have significant improvement for a while with change of his Parkinson medications, to Stalevo 200 5 times a day, but it is difficult for him to get 5 doses in due to his participate in pace program, he is now only take 4 times a day,  There was acute change few weeks ago, he become quiet with increased gait abnormality, hard of hearing, increased memory loss, rely on his niece to provide most the history,  UPDATE Sep 02 2020: He is accompanied by his niece [name] at today's visit, overall doing very well, only taking Stalevo 200 3 times daily,  I personally reviewed MRI in October 2021, mild generalized atrophy, supratentorium small vessel disease, no acute abnormality, 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,  Patient has urinary frequency, ambulate okay with walker, has caregiver 6 hours each day, his niece [name] check on him  UPDATE February 22 2022: He is accompanied by his niece [name] at today's visit, he still lives alone at home, attempt pace program couple times each week, rest of the time he has caregiver 5 hours each day,  He is currently taking carbidopa extended release 50/200 mg twice a day at 8 AM, 7 PM, [name] reported lower dose seems to help him better, he is more alert, previously he was on 3 times a day,  He sleeps well, when  the caregiver, he does get opportunity to walk around the neighborhood some, his older sister, [name]'s mother is currently at nursing home for advanced dementia  UPDATE August 29th 2024: He is with his niece [name] at today's visit, he is now at [employer], Since August 23, he stop moving, more confused, looked medicine administration sheets,  He was given Azilect 1mg  daily, sinemet CR 50/200 mg once at 10 AM instead of prescribed twice a day,  Renae Fickle reported he was moving well taking twice a day, able to walk in his wheelchair using bathroom, he also have excessive drooling  PHYSICAL EXAM   Vitals:   05/18/23 1302  BP: 120/74  Pulse: 91  Resp: 16  Height: 5\' 8"  (1.727 m)   Body mass index is 21.29 kg/m.  PHYSICAL EXAMNIATION: MENTAL STATUS: Sitting in wheelchair, eyes closed, compliant with one-step command, relies on his niece to provide history    09/21/2021   10:05 AM 06/04/2020    9:00 AM  MMSE - Mini Mental State Exam  Orientation to time 1 1  Orientation to Place 4 2  Registration 3 3  Attention/ Calculation 0 3  Recall 3 1  Language- name 2 objects 2 2  Language- repeat 1 1  Language- follow 3 step command 3 3  Language- read & follow direction 1 1  Write a sentence 0 0  Write a sentence-comments unable to write   Copy design 0 0  Copy design-comments unable to write   Total score 18 17   Cranial nerves: Facial symmetry is present. Extraocular movements are full.  Tends to have his eyes closed  Motor: No weakness, right more than left rigidity, bradykinesia  Sensory examination: Intact to light touch  Coordination: Mild bradykinesia with finger-nose-finger bilaterally, heel-to-shin  Gait and station: Deferred   REVIEW OF SYSTEMS: Full 14 system review of systems performed and notable only for as above  See HPI  ALLERGIES: No Known Allergies  HOME MEDICATIONS: Current Outpatient Medications  Medication Sig Dispense Refill   atorvastatin  (LIPITOR) 40 MG tablet Take 40 mg by mouth at bedtime.     brimonidine (ALPHAGAN) 0.2 % ophthalmic solution Place 1 drop into both eyes 2 (two) times daily.     carbidopa-levodopa (SINEMET CR) 50-200 MG tablet At 8am, 2pm 180 tablet 3   carboxymethylcellulose (REFRESH PLUS) 0.5 % SOLN Place 2 drops into both eyes daily as needed.     cholecalciferol (VITAMIN D3) 25 MCG (1000 UNIT) tablet Take 1,000 Units by mouth daily.     Cyanocobalamin (VITAMIN B 12 PO) Take 1,000 mcg by mouth daily.     dorzolamide-timolol (COSOPT) 22.3-6.8 MG/ML ophthalmic solution Place 1 drop into both eyes 2 (two) times daily.     Iron-Vitamin C (VITRON-C PO) Take 1 tablet by mouth daily.     metoprolol succinate (TOPROL-XL) 100 MG 24 hr tablet Take 100 mg by mouth daily. Take with or immediately following a meal.     omeprazole (PRILOSEC) 20 MG  capsule Take 20 mg by mouth daily.     polyethylene glycol (MIRALAX / GLYCOLAX) 17 g packet Take 17 g by mouth 3 (three) times a week.     potassium chloride SA (KLOR-CON) 20 MEQ tablet Take 20 mEq by mouth daily.     rasagiline (AZILECT) 1 MG TABS tablet Take 1 tablet (1 mg total) by mouth daily. 90 tablet 4   ROCKLATAN 0.02-0.005 % SOLN Apply 1 drop to eye daily.     RSV vaccine recomb adjuvanted (AREXVY) 120 MCG/0.5ML injection Inject 0.5 mLs into the muscle once.     No current facility-administered medications for this visit.    PAST MEDICAL HISTORY: Past Medical History:  Diagnosis Date   Bilateral cataracts    Hypercholesteremia    Hypertension    IDA (iron deficiency anemia)    Oropharyngeal dysphagia    Parkinson disease    Primary open angle glaucoma (POAG) of both eyes     PAST SURGICAL HISTORY: Past Surgical History:  Procedure Laterality Date   BIOPSY  12/07/2020   Procedure: BIOPSY;  Surgeon: Sherrilyn Rist, MD;  Location: [facility];  Service: Gastroenterology;;   CATARACT EXTRACTION, BILATERAL     COLONOSCOPY WITH PROPOFOL N/A 12/07/2020    Procedure: COLONOSCOPY WITH PROPOFOL;  Surgeon: Sherrilyn Rist, MD;  Location: [facility];  Service: Gastroenterology;  Laterality: N/A;   ESOPHAGOGASTRODUODENOSCOPY (EGD) WITH PROPOFOL N/A 12/07/2020   Procedure: ESOPHAGOGASTRODUODENOSCOPY (EGD) WITH PROPOFOL;  Surgeon: Sherrilyn Rist, MD;  Location: [facility];  Service: Gastroenterology;  Laterality: N/A;   HEMOSTASIS CLIP PLACEMENT  12/07/2020   Procedure: HEMOSTASIS CLIP PLACEMENT;  Surgeon: Sherrilyn Rist, MD;  Location: [facility];  Service: Gastroenterology;;   POLYPECTOMY  12/07/2020   Procedure: POLYPECTOMY;  Surgeon: Sherrilyn Rist, MD;  Location: [facility];  Service: Gastroenterology;;    FAMILY HISTORY: Family History  Problem Relation Age of Onset   Stroke Mother    Diabetes Mother    Other Father        brain tumor - not sure if is was cancerous    SOCIAL HISTORY: Social History   Socioeconomic History   Marital status: Single    Spouse name: Not on file   Number of children: 1   Years of education: 12   Highest education level: High school graduate  Occupational History   Occupation: Retired  Tobacco Use   Smoking status: Never   Smokeless tobacco: Never  Substance and Sexual Activity   Alcohol use: Not Currently   Drug use: Never   Sexual activity: Not on file  Other Topics Concern   Not on file  Social History Narrative   06/04/20 Lives alone. Caregivers daily   Right-handed.   One cup coffee per day.   Social Determinants of Health   Financial Resource Strain: Not on file  Food Insecurity: Not on file  Transportation Needs: Not on file  Physical Activity: Not on file  Stress: Not on file  Social Connections: Not on file  Intimate Partner Violence: Not on file       Levert Feinstein, M.D. Ph.D.  [facility] [address] [place], [place] [address] Phone: [phone] Fax:      [phone]

## 2023-06-03 ENCOUNTER — Emergency Department (HOSPITAL_COMMUNITY)
Admission: EM | Admit: 2023-06-03 | Discharge: 2023-06-03 | Disposition: A | Discharge status: Other Acute Inpatient Hospital | Payer: Medicare (Managed Care) | Attending: Emergency Medicine | Admitting: Emergency Medicine

## 2023-06-03 ENCOUNTER — Emergency Department (HOSPITAL_COMMUNITY): Payer: Medicare (Managed Care)

## 2023-06-03 DIAGNOSIS — I10 Essential (primary) hypertension: Secondary | ICD-10-CM | POA: Insufficient documentation

## 2023-06-03 DIAGNOSIS — S0003XA Contusion of scalp, initial encounter: Secondary | ICD-10-CM | POA: Diagnosis not present

## 2023-06-03 DIAGNOSIS — S0990XA Unspecified injury of head, initial encounter: Secondary | ICD-10-CM | POA: Diagnosis present

## 2023-06-03 DIAGNOSIS — G20C Parkinsonism, unspecified: Secondary | ICD-10-CM | POA: Insufficient documentation

## 2023-06-03 DIAGNOSIS — W19XXXA Unspecified fall, initial encounter: Secondary | ICD-10-CM

## 2023-06-03 DIAGNOSIS — W06XXXA Fall from bed, initial encounter: Secondary | ICD-10-CM | POA: Insufficient documentation

## 2023-06-03 DIAGNOSIS — Z79899 Other long term (current) drug therapy: Secondary | ICD-10-CM | POA: Insufficient documentation

## 2023-06-03 LAB — BASIC METABOLIC PANEL
Anion gap: 11 (ref 5–15)
BUN: 6 mg/dL — ABNORMAL LOW (ref 8–23)
CO2: 26 mmol/L (ref 22–32)
Calcium: 9.5 mg/dL (ref 8.9–10.3)
Chloride: 102 mmol/L (ref 98–111)
Creatinine, Ser: 0.76 mg/dL (ref 0.61–1.24)
GFR, Estimated: >60 mL/min (ref >60)
Glucose, Bld: 190 mg/dL — ABNORMAL HIGH (ref 70–99)
Potassium: 4.1 mmol/L (ref 3.5–5.1)
Sodium: 139 mmol/L (ref 135–145)

## 2023-06-03 LAB — CBG MONITORING, ED
Glucose-Capillary: 159 mg/dL — ABNORMAL HIGH (ref 70–99)
Glucose-Capillary: 46 mg/dL — ABNORMAL LOW (ref 70–99)
Glucose-Capillary: <10 mg/dL — CL (ref 70–99)
Glucose-Capillary: 104 mg/dL — ABNORMAL HIGH (ref 70–99)

## 2023-06-03 LAB — CBC
HCT: 45.7 % (ref 39.0–52.0)
Hemoglobin: 14.2 g/dL (ref 13.0–17.0)
MCH: 27 pg (ref 26.0–34.0)
MCHC: 31.1 g/dL (ref 30.0–36.0)
MCV: 87 fL (ref 80.0–100.0)
Platelets: 287 10*3/uL (ref 150–400)
RBC: 5.25 MIL/uL (ref 4.22–5.81)
RDW: 14.6 % (ref 11.5–15.5)
WBC: 4.5 10*3/uL (ref 4.0–10.5)
nRBC: 0 % (ref 0.0–0.2)

## 2023-06-03 MED ORDER — ACETAMINOPHEN 500 MG PO TABS
1000.0000 mg | ORAL_TABLET | Freq: Once | ORAL | Status: DC
  Filled 2023-06-03: qty 2

## 2023-06-03 NOTE — Discharge Instructions (Signed)
You have been evaluated for your fall.  You have suffered a minor head injury with contusion of your forehead.  Fortunately no evidence of any broken bone or blood in your brain.  You may continue to take over-the-counter Tylenol as needed for pain.  Please be aware your blood pressure is elevated today, have it rechecked by your doctor.

## 2023-06-03 NOTE — ED Provider Notes (Signed)
[place] EMERGENCY DEPARTMENT AT [facility] Provider Note   CSN: [id number] Arrival date & time: 06/03/23  7829     History  Chief Complaint  Patient presents with   Fall    Patient rolled out of bed at [facility] and rehab. Patient denies pain. Patient has a hematoma on right side of forehead.    Armster Slover is a 77 y.o. male.  The history is provided by the patient, the EMS personnel and medical records. No language interpreter was used.  Fall     77 year old male with significant history of Parkinson disease, hypertension, iron deficiency anemia, memory loss, brought here via EMS from [facility] facility for evaluation of a recent fall.  History is a bit difficult to obtain due to his baseline illness.  Patient did states that he fell.  He is unable to tell me how he fell or what happened leading up to it.  He did states he fell to the ground and hit his head but denies any significant headache or neck pain denies any pain to his arms or legs no complaints of chest pain abdominal pain or back pain.  Pain no report of any urinary symptoms.  Unsure if he has any loss of consciousness.  History is limited.  Home Medications Prior to Admission medications   Medication Sig Start Date End Date Taking? Authorizing Provider  atorvastatin (LIPITOR) 40 MG tablet Take 40 mg by mouth at bedtime. 03/28/18   [provider]  brimonidine (ALPHAGAN) 0.2 % ophthalmic solution Place 1 drop into both eyes 2 (two) times daily.    [provider]  carbidopa-levodopa (SINEMET CR) 50-200 MG tablet At 8am, 2pm 02/21/22   Levert Feinstein, MD  carboxymethylcellulose (REFRESH PLUS) 0.5 % SOLN Place 2 drops into both eyes daily as needed.    [provider]  cholecalciferol (VITAMIN D3) 25 MCG (1000 UNIT) tablet Take 1,000 Units by mouth daily.    [provider]  Cyanocobalamin (VITAMIN B 12 PO) Take 1,000 mcg by mouth daily.    [provider]  dorzolamide-timolol (COSOPT) 22.3-6.8 MG/ML ophthalmic solution Place 1 drop into both eyes 2 (two) times daily.    [provider]  Iron-Vitamin C (VITRON-C PO) Take 1 tablet by mouth daily.    [provider]  metoprolol succinate (TOPROL-XL) 100 MG 24 hr tablet Take 100 mg by mouth daily. Take with or immediately following a meal.    [provider]  omeprazole (PRILOSEC) 20 MG capsule Take 20 mg by mouth daily.    [provider]  polyethylene glycol (MIRALAX / GLYCOLAX) 17 g packet Take 17 g by mouth 3 (three) times a week.    [provider]  potassium chloride SA (KLOR-CON) 20 MEQ tablet Take 20 mEq by mouth daily.    [provider]  rasagiline (AZILECT) 1 MG TABS tablet Take 1 tablet (1 mg total) by mouth daily. 02/21/22   Levert Feinstein, MD  ROCKLATAN 0.02-0.005 % SOLN Apply 1 drop to eye daily. 10/13/22   [provider]  RSV vaccine recomb adjuvanted (AREXVY) 120 MCG/0.5ML injection Inject 0.5 mLs into the muscle once.    [provider]      Allergies    Patient has no known allergies.    Review of Systems   Review of Systems  Unable to perform ROS: Dementia    Physical Exam Updated Vital Signs BP (!) 169/89   Pulse  70   Temp 99.3 F (37.4 C) (Oral)   Resp 17   Ht 5\' 8"  (1.727 m)   Wt 63 kg   SpO2 97%   BMI 21.12 kg/m  Physical Exam Vitals and nursing note reviewed.  Constitutional:      General: He is not in acute distress.    Appearance: He is well-developed.     Comments: Patient is laying bed appears to be in no acute discomfort.  C-collar is in place.  HENT:     Head: Normocephalic.     Comments: Small hematoma noted to right forehead mildly tender to palpation without any crepitus.  No significant midface tenderness no raccoons eyes no Battle sign no malocclusion Eyes:     Conjunctiva/sclera: Conjunctivae normal.  Neck:     Comments: C-collar in place, no significant midline  cervical spine tenderness crepitus or step-off Cardiovascular:     Rate and Rhythm: Normal rate and regular rhythm.     Pulses: Normal pulses.     Heart sounds: Normal heart sounds.  Pulmonary:     Effort: Pulmonary effort is normal.     Breath sounds: Normal breath sounds.  Abdominal:     Palpations: Abdomen is soft.  Musculoskeletal:        General: No tenderness (No significant midline spine tenderness).     Cervical back: Neck supple.     Comments: Able to move all 4 extremities without any tenderness to palpation of the shoulders elbows and wrists  Skin:    Findings: No rash.  Neurological:     Mental Status: He is alert. Mental status is at baseline.     ED Results / Procedures / Treatments   Labs (all labs ordered are listed, but only abnormal results are displayed) Labs Reviewed  BASIC METABOLIC PANEL - Abnormal; Notable for the following components:      Result Value   Glucose, Bld 190 (*)    BUN 6 (*)    All other components within normal limits  CBG MONITORING, ED - Abnormal; Notable for the following components:   Glucose-Capillary 46 (*)    All other components within normal limits  CBG MONITORING, ED - Abnormal; Notable for the following components:   Glucose-Capillary <10 (*)    All other components within normal limits  CBG MONITORING, ED - Abnormal; Notable for the following components:   Glucose-Capillary 159 (*)    All other components within normal limits  CBG MONITORING, ED - Abnormal; Notable for the following components:   Glucose-Capillary 104 (*)    All other components within normal limits  CBC  URINALYSIS, ROUTINE W REFLEX MICROSCOPIC    EKG EKG Interpretation Date/Time:  Saturday June 03 2023 08:31:42 EDT Ventricular Rate:  64 PR Interval:  151 QRS Duration:  91 QT Interval:  416 QTC Calculation: 430 R Axis:   10  Text Interpretation: Sinus rhythm Left ventricular hypertrophy Inferior infarct, age indeterminate agree.  nonspecific st flattening laterally. no old comparison Confirmed by Arby Barrette [phone]) on 06/03/2023 8:42:00 AM  Radiology CT HEAD WO CONTRAST  Result Date: 06/03/2023 CLINICAL DATA:  Blunt poly trauma EXAM: CT HEAD WITHOUT CONTRAST CT CERVICAL SPINE WITHOUT CONTRAST TECHNIQUE: Multidetector CT imaging of the head and cervical spine was performed following the standard protocol without intravenous contrast. Multiplanar CT image reconstructions of the cervical spine were also generated. RADIATION DOSE REDUCTION: This exam was performed according to the departmental dose-optimization program which includes automated exposure control, adjustment of the mA  and/or kV according to patient size and/or use of iterative reconstruction technique. COMPARISON:  06/27/2020 brain MRI FINDINGS: CT HEAD FINDINGS Brain: No evidence of acute infarction, hemorrhage, hydrocephalus, extra-axial collection or mass lesion/mass effect. Generalized brain atrophy. Mild chronic small vessel ischemia in the cerebral white matter. Vascular: No hyperdense vessel or unexpected calcification. Skull: Right anterior scalp swelling. No acute fracture. Evidence of remote right tripod fracture. Sinuses/Orbits: No acute finding CT CERVICAL SPINE FINDINGS Alignment: Normal. Skull base and vertebrae: No acute fracture. No primary bone lesion or focal pathologic process. Soft tissues and spinal canal: No prevertebral fluid or swelling. No visible canal hematoma. Dermal inclusion cyst in the subcutaneous posterior midline Disc levels:  Generalized degenerative endplate and facet spurring. Upper chest: No evidence of injury IMPRESSION: No evidence of intracranial or cervical spine injury. Electronically Signed   By: Tiburcio Pea M.D.   On: 06/03/2023 07:58   CT Cervical Spine Wo Contrast  Result Date: 06/03/2023 CLINICAL DATA:  Blunt poly trauma EXAM: CT HEAD WITHOUT CONTRAST CT CERVICAL SPINE WITHOUT CONTRAST TECHNIQUE: Multidetector CT  imaging of the head and cervical spine was performed following the standard protocol without intravenous contrast. Multiplanar CT image reconstructions of the cervical spine were also generated. RADIATION DOSE REDUCTION: This exam was performed according to the departmental dose-optimization program which includes automated exposure control, adjustment of the mA and/or kV according to patient size and/or use of iterative reconstruction technique. COMPARISON:  06/27/2020 brain MRI FINDINGS: CT HEAD FINDINGS Brain: No evidence of acute infarction, hemorrhage, hydrocephalus, extra-axial collection or mass lesion/mass effect. Generalized brain atrophy. Mild chronic small vessel ischemia in the cerebral white matter. Vascular: No hyperdense vessel or unexpected calcification. Skull: Right anterior scalp swelling. No acute fracture. Evidence of remote right tripod fracture. Sinuses/Orbits: No acute finding CT CERVICAL SPINE FINDINGS Alignment: Normal. Skull base and vertebrae: No acute fracture. No primary bone lesion or focal pathologic process. Soft tissues and spinal canal: No prevertebral fluid or swelling. No visible canal hematoma. Dermal inclusion cyst in the subcutaneous posterior midline Disc levels:  Generalized degenerative endplate and facet spurring. Upper chest: No evidence of injury IMPRESSION: No evidence of intracranial or cervical spine injury. Electronically Signed   By: Tiburcio Pea M.D.   On: 06/03/2023 07:58    Procedures Procedures    Medications Ordered in ED Medications - No data to display  ED Course/ Medical Decision Making/ A&P                                 Medical Decision Making Amount and/or Complexity of Data Reviewed Labs: ordered. Radiology: ordered.   BP (!) 169/89   Pulse 70   Temp 99.3 F (37.4 C) (Oral)   Resp 17   Ht 5\' 8"  (1.727 m)   Wt 63 kg   SpO2 97%   BMI 21.12 kg/m   48:88 AM 77 year old male with significant history of Parkinson disease,  hypertension, iron deficiency anemia, memory loss, brought here via EMS from [facility] facility for evaluation of a recent fall.  History is a bit difficult to obtain due to his baseline illness.  Patient did states that he fell.  He is unable to tell me how he fell or what happened leading up to it.  He did states he fell to the ground and hit his head but denies any significant headache or neck pain denies any pain to his arms  or legs no complaints of chest pain abdominal pain or back pain.  Pain no report of any urinary symptoms.  Unsure if he has any loss of consciousness.  History is limited.  On exam this is an elderly male laying bed appears to be in no acute discomfort.  He does have a hematoma noted to his right forehead and.  He does not have any other signs of reproducible pain or injuries noted.  He does follows commands, however I am having difficult time understanding his speech as this is likely due to his baseline memory loss and Parkinson disease.  Workup initiated plan to obtain appropriate imaging and labs for further assessment.  Patient is a DNR/DNI  Additional history was obtained through patient's niece who is now at bedside.  Story involve patient CNA cleaning him up this morning and while rolling over he fell off the bed and strike his head.  No report of any loss of consciousness.  -Labs ordered, independently viewed and interpreted by me.  Labs remarkable for CBG 46, improves with food and now stable at 104. -The patient was maintained on a cardiac monitor.  I personally viewed and interpreted the cardiac monitored which showed an underlying rhythm of: NSR -Imaging independently viewed and interpreted by me and I agree with radiologist's interpretation.  Result remarkable for head/cspine CT without acute changes -This patient presents to the ED for concern of fall, this involves an extensive number of treatment options, and is a complaint that carries with it a  high risk of complications and morbidity.  The differential diagnosis includes fx, dislocation, strain, sprain, contusion, intracranial head bleed -Co morbidities that complicate the patient evaluation includes parkinson, HTN -Treatment includes monitoring, food -Reevaluation of the patient after these medicines showed that the patient improved -PCP office notes or outside notes reviewed -Discussion with attending Dr. Donnald Garre -Escalation to admission/observation considered: patients feels much better, is comfortable with discharge, and will follow up with PCP -Prescription medication considered, patient comfortable with home medication -Social Determinant of Health considered         Final Clinical Impression(s) / ED Diagnoses Final diagnoses:  Fall, initial encounter  Contusion of scalp, initial encounter  Minor head injury, initial encounter    Rx / DC Orders ED Discharge Orders     None         Fayrene Helper, PA-C 06/03/23 1147    Arby Barrette, MD 06/03/23 1627

## 2023-06-03 NOTE — ED Notes (Signed)
Patient rolled out of bed at [facility] and rehab. Patient denies pain. Patient has a hematoma on right side of forehead.

## 2023-06-03 NOTE — ED Provider Notes (Signed)
I provided a substantive portion of the care of this patient.  I personally made/approved the management plan for this patient and take responsibility for the patient management.     Patient history of Parkinson's dementia.  Patient is at [facility].  Patient fell out of bed and has hematoma to the forehead.  Patient has no complaints.  He is denying any pain.  Patient is pleasant and alert.  No acute distress.  Hematoma to the right forehead.  Heart regular.  Breath sounds clear.  No focal with extremity movements.  I agree with plan of management.   Arby Barrette, MD 06/03/23 [phone]

## 2023-06-03 NOTE — ED Notes (Signed)
Report called to [facility] and given to [employer]. Pt taken home by [facility] in ambulance.

## 2023-06-03 NOTE — ED Notes (Signed)
Pt given orange juice . Pt able to drink cup of orange juice. Pt is alert and oriented

## 2023-06-03 NOTE — ED Notes (Signed)
Report called to [facility]

## 2023-06-07 ENCOUNTER — Telehealth: Payer: Self-pay | Admitting: Neurology

## 2023-06-07 DIAGNOSIS — G20A1 Parkinson's disease without dyskinesia, without mention of fluctuations: Secondary | ICD-10-CM

## 2023-06-07 DIAGNOSIS — R269 Unspecified abnormalities of gait and mobility: Secondary | ICD-10-CM

## 2023-06-07 NOTE — Addendum Note (Signed)
Addended by: Levert Feinstein on: 06/07/2023 04:53 PM   Modules accepted: Orders

## 2023-06-07 NOTE — Telephone Encounter (Signed)
Orders Placed This Encounter  Procedures  . Ambulatory referral to Physical Therapy

## 2023-06-07 NOTE — Telephone Encounter (Signed)
Pt's niece reports that for the PT that Dr Terrace Arabia wants pt to have an order needs to be sent to [facility], their phone #[phone] fax#[phone] MD there is Dr Renato Gails

## 2023-06-08 ENCOUNTER — Telehealth: Payer: Self-pay | Admitting: Neurology

## 2023-06-08 NOTE — Telephone Encounter (Signed)
Referral for physical therapy fax to [facility]. Phone: [phone], Fax: [phone]. As requested by patient

## 2023-06-08 NOTE — Telephone Encounter (Signed)
Called and told pt neiece paula that we have placed pt orders she voiced gratitude and understanding

## 2023-12-11 NOTE — Progress Notes (Unsigned)
 ASSESSMENT AND PLAN  Eric Bennett is a 78 y.o. male   Idiopathic Parkinson's disease Dementia  He was noted to have increased gait difficulty, most related to lower dose of Sinemet, was only giving once a day, will go back to previous dosage CR 50/200 mg twice a day, at 9 AM, 3 PM, keep Azilect 1 mg daily  Also ordered physical therapy  Hypersialorrhea  He already developed mild swallowing difficulty, I suggested [name] sugarless lollipop to stimulate swallowing reflex, if that does not work, may consider glycopyrrolate 1 mg twice daily as needed, even Botox injection   DIAGNOSTIC DATA (LABS, IMAGING, TESTING) - I reviewed patient records, labs, notes, testing and imaging myself where available.  HISTORICAL  Eric Bennett is a 78 year old male, seen in request by his primary care physician Dr. Sherrin Daisy, Glynda Jaeger A, accompanied by his niece [name] for evaluation of Parkinson's disease, initial evaluation was on October 17, 2019.  I have reviewed and summarized the referring note from the referring physician.  He has past medical history of hypertension, hyperlipidemia,  He was diagnosed with Parkinson's disease around 2013, presented with gait abnormality, has been treated with titrating dose of Sinemet, has been on current dose 25/250 mg 4 times a day at 6, 10, 2, 6 PM per patient over the past few years,  Despite titrating dose of levodopa, he had a gradual decline of his functional status, increased gait abnormality, rely on his walker, also have frequent freezing, difficulty initiate gait, fell multiple times,  He has slow worsening hypersialorrhea, wet slurred speech,  I reviewed the last neurology visit by Dr. Antonietta Barcelona on August 26, 2019, increase the Sinemet  to 25/250 mg every 4 hours instead of 5 hours interval, also recommended speech and dysphagia evaluation, he is also taking pramipexole extended release 1.5 mg at bedtime, entacapone 200 mg 3 times a day  MRI of the brain  without contrast in February 2018: No evidence of acute abnormality, mild generalized atrophy,  UPDATE December 19 2019: Following last visit in January 2021, I have changed him to Stalevo 200 5 times a day at 70, 9, 12, 15, 18, (from previous Sinemet 25/250 mg 4 times a day, entacapone 200 mg 3 times a day), is also taking extended release pramipexole 1.5 mg at bedtime, he can move much better, much less frequent freezing spells,   UPDATE Sept 16 2021: He is accompanied by his niece [name] at today's visit, will check on him on a daily basis, he also has caregiver with him majority of the day, he still lives by himself,  He was noted to have significant improvement for a while with change of his Parkinson medications, to Stalevo 200 5 times a day, but it is difficult for him to get 5 doses in due to his participate in pace program, he is now only take 4 times a day,  There was acute change few weeks ago, he become quiet with increased gait abnormality, hard of hearing, increased memory loss, rely on his niece to provide most the history,  UPDATE Sep 02 2020: He is accompanied by his niece [name] at today's visit, overall doing very well, only taking Stalevo 200 3 times daily,  I personally reviewed MRI in October 2021, mild generalized atrophy, supratentorium small vessel disease, no acute abnormality, 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,  Patient has urinary frequency, ambulate okay with walker, has caregiver 6 hours each day, his niece [name] check on him   UPDATE  February 22 2022: He is accompanied by his niece [name] at today's visit, he still lives alone at home, attempt pace program couple times each week, rest of the time he has caregiver 5 hours each day,  He is currently taking carbidopa extended release 50/200 mg twice a day at 8 AM, 7 PM, [name] reported lower dose seems to help him better, he is more alert, previously he was on 3 times a day,  He sleeps well, when the  caregiver, he does get opportunity to walk around the neighborhood some, his older sister, [name]'s mother is currently at nursing home for advanced dementia  UPDATE August 29th 2024: He is with his niece [name] at today's visit, he is now at [employer], Since August 23, he stop moving, more confused, looked medicine administration sheets,  He was given Azilect 1mg  daily, sinemet CR 50/200 mg once at 10 AM instead of prescribed twice a day,  Renae Fickle reported he was moving well taking twice a day, able to walk in his wheelchair using bathroom, he also have excessive drooling  Update 12/12/23 SS:   PHYSICAL EXAM   Vitals:   05/18/23 1302  BP: 120/74  Pulse: 91  Resp: 16  Height: 5\' 8"  (1.727 m)   Body mass index is 21.29 kg/m.  PHYSICAL EXAMNIATION: MENTAL STATUS: Sitting in wheelchair, eyes closed, compliant with one-step command, relies on his niece to provide history    09/21/2021   10:05 AM 06/04/2020    9:00 AM  MMSE - Mini Mental State Exam  Orientation to time 1 1  Orientation to Place 4 2  Registration 3 3  Attention/ Calculation 0 3  Recall 3 1  Language- name 2 objects 2 2  Language- repeat 1 1  Language- follow 3 step command 3 3  Language- read & follow direction 1 1  Write a sentence 0 0  Write a sentence-comments unable to write   Copy design 0 0  Copy design-comments unable to write   Total score 18 17   Cranial nerves: Facial symmetry is present. Extraocular movements are full.  Tends to have his eyes closed  Motor: No weakness, right more than left rigidity, bradykinesia  Sensory examination: Intact to light touch  Coordination: Mild bradykinesia with finger-nose-finger bilaterally, heel-to-shin  Gait and station: Deferred   REVIEW OF SYSTEMS: Full 14 system review of systems performed and notable only for as above  See HPI  ALLERGIES: No Known Allergies  HOME MEDICATIONS: Current Outpatient Medications  Medication Sig Dispense  Refill   atorvastatin (LIPITOR) 40 MG tablet Take 40 mg by mouth at bedtime.     brimonidine (ALPHAGAN) 0.2 % ophthalmic solution Place 1 drop into both eyes 2 (two) times daily.     carbidopa-levodopa (SINEMET CR) 50-200 MG tablet At 8am, 2pm 180 tablet 3   carboxymethylcellulose (REFRESH PLUS) 0.5 % SOLN Place 2 drops into both eyes daily as needed.     cholecalciferol (VITAMIN D3) 25 MCG (1000 UNIT) tablet Take 1,000 Units by mouth daily.     Cyanocobalamin (VITAMIN B 12 PO) Take 1,000 mcg by mouth daily.     dorzolamide-timolol (COSOPT) 22.3-6.8 MG/ML ophthalmic solution Place 1 drop into both eyes 2 (two) times daily.     Iron-Vitamin C (VITRON-C PO) Take 1 tablet by mouth daily.     metoprolol succinate (TOPROL-XL) 100 MG 24 hr tablet Take 100 mg by mouth daily. Take with or immediately following a meal.  omeprazole (PRILOSEC) 20 MG capsule Take 20 mg by mouth daily.     polyethylene glycol (MIRALAX / GLYCOLAX) 17 g packet Take 17 g by mouth 3 (three) times a week.     potassium chloride SA (KLOR-CON) 20 MEQ tablet Take 20 mEq by mouth daily.     rasagiline (AZILECT) 1 MG TABS tablet Take 1 tablet (1 mg total) by mouth daily. 90 tablet 4   ROCKLATAN 0.02-0.005 % SOLN Apply 1 drop to eye daily.     RSV vaccine recomb adjuvanted (AREXVY) 120 MCG/0.5ML injection Inject 0.5 mLs into the muscle once.     No current facility-administered medications for this visit.    PAST MEDICAL HISTORY: Past Medical History:  Diagnosis Date   Bilateral cataracts    Hypercholesteremia    Hypertension    IDA (iron deficiency anemia)    Oropharyngeal dysphagia    Parkinson disease    Primary open angle glaucoma (POAG) of both eyes     PAST SURGICAL HISTORY: Past Surgical History:  Procedure Laterality Date   BIOPSY  12/07/2020   Procedure: BIOPSY;  Surgeon: Sherrilyn Rist, MD;  Location: [facility];  Service: Gastroenterology;;   CATARACT EXTRACTION, BILATERAL     COLONOSCOPY WITH  PROPOFOL N/A 12/07/2020   Procedure: COLONOSCOPY WITH PROPOFOL;  Surgeon: Sherrilyn Rist, MD;  Location: [facility];  Service: Gastroenterology;  Laterality: N/A;   ESOPHAGOGASTRODUODENOSCOPY (EGD) WITH PROPOFOL N/A 12/07/2020   Procedure: ESOPHAGOGASTRODUODENOSCOPY (EGD) WITH PROPOFOL;  Surgeon: Sherrilyn Rist, MD;  Location: [facility];  Service: Gastroenterology;  Laterality: N/A;   HEMOSTASIS CLIP PLACEMENT  12/07/2020   Procedure: HEMOSTASIS CLIP PLACEMENT;  Surgeon: Sherrilyn Rist, MD;  Location: [facility];  Service: Gastroenterology;;   POLYPECTOMY  12/07/2020   Procedure: POLYPECTOMY;  Surgeon: Sherrilyn Rist, MD;  Location: [facility];  Service: Gastroenterology;;    FAMILY HISTORY: Family History  Problem Relation Age of Onset   Stroke Mother    Diabetes Mother    Other Father        brain tumor - not sure if is was cancerous    SOCIAL HISTORY: Social History   Socioeconomic History   Marital status: Single    Spouse name: Not on file   Number of children: 1   Years of education: 12   Highest education level: High school graduate  Occupational History   Occupation: Retired  Tobacco Use   Smoking status: Never   Smokeless tobacco: Never  Substance and Sexual Activity   Alcohol use: Not Currently   Drug use: Never   Sexual activity: Not on file  Other Topics Concern   Not on file  Social History Narrative   06/04/20 Lives alone. Caregivers daily   Right-handed.   One cup coffee per day.   Social Drivers of Corporate investment banker Strain: Not on file  Food Insecurity: Not on file  Transportation Needs: Not on file  Physical Activity: Not on file  Stress: Not on file  Social Connections: Not on file  Intimate Partner Violence: Not on file   Margie Ege, Edrick Oh, DNP  [facility] [address], [address] [place], [place] [address] [phone]

## 2023-12-12 ENCOUNTER — Ambulatory Visit (INDEPENDENT_AMBULATORY_CARE_PROVIDER_SITE_OTHER): Payer: Medicare (Managed Care) | Admitting: Neurology

## 2023-12-12 ENCOUNTER — Encounter: Payer: Self-pay | Admitting: Neurology

## 2023-12-12 VITALS — BP 173/90 | HR 67 | Ht 71.0 in | Wt 138.9 lb

## 2023-12-12 DIAGNOSIS — R413 Other amnesia: Secondary | ICD-10-CM | POA: Diagnosis not present

## 2023-12-12 DIAGNOSIS — R269 Unspecified abnormalities of gait and mobility: Secondary | ICD-10-CM

## 2023-12-12 DIAGNOSIS — G20A1 Parkinson's disease without dyskinesia, without mention of fluctuations: Secondary | ICD-10-CM | POA: Diagnosis not present

## 2023-12-12 NOTE — Patient Instructions (Addendum)
 Continue current dose of Sinemet and Azilect.  Continue physical therapy.  Continue with lollipop for sialorrhea.  Follow-up in 6 months.

## 2023-12-23 NOTE — Progress Notes (Signed)
 Chart reviewed, agree above plan ?

## 2024-04-03 ENCOUNTER — Encounter (HOSPITAL_COMMUNITY): Payer: Self-pay

## 2024-04-03 ENCOUNTER — Other Ambulatory Visit: Payer: Self-pay

## 2024-04-03 ENCOUNTER — Inpatient Hospital Stay (HOSPITAL_COMMUNITY)
Admission: EM | Admit: 2024-04-03 | Discharge: 2024-04-19 | DRG: 871 | Disposition: E | Payer: Medicare (Managed Care) | Source: Skilled Nursing Facility | Attending: Hospitalist | Admitting: Hospitalist

## 2024-04-03 ENCOUNTER — Emergency Department (HOSPITAL_COMMUNITY): Payer: Medicare (Managed Care)

## 2024-04-03 DIAGNOSIS — J189 Pneumonia, unspecified organism: Secondary | ICD-10-CM | POA: Diagnosis present

## 2024-04-03 DIAGNOSIS — K529 Noninfective gastroenteritis and colitis, unspecified: Secondary | ICD-10-CM | POA: Diagnosis present

## 2024-04-03 DIAGNOSIS — H401130 Primary open-angle glaucoma, bilateral, stage unspecified: Secondary | ICD-10-CM | POA: Diagnosis present

## 2024-04-03 DIAGNOSIS — Z66 Do not resuscitate: Secondary | ICD-10-CM | POA: Diagnosis present

## 2024-04-03 DIAGNOSIS — Z681 Body mass index (BMI) 19 or less, adult: Secondary | ICD-10-CM | POA: Diagnosis not present

## 2024-04-03 DIAGNOSIS — N3000 Acute cystitis without hematuria: Secondary | ICD-10-CM | POA: Diagnosis present

## 2024-04-03 DIAGNOSIS — N179 Acute kidney failure, unspecified: Secondary | ICD-10-CM | POA: Diagnosis present

## 2024-04-03 DIAGNOSIS — E7800 Pure hypercholesterolemia, unspecified: Secondary | ICD-10-CM | POA: Diagnosis present

## 2024-04-03 DIAGNOSIS — G20A1 Parkinson's disease without dyskinesia, without mention of fluctuations: Secondary | ICD-10-CM | POA: Diagnosis present

## 2024-04-03 DIAGNOSIS — J9601 Acute respiratory failure with hypoxia: Secondary | ICD-10-CM | POA: Diagnosis present

## 2024-04-03 DIAGNOSIS — Z79899 Other long term (current) drug therapy: Secondary | ICD-10-CM

## 2024-04-03 DIAGNOSIS — I10 Essential (primary) hypertension: Secondary | ICD-10-CM | POA: Diagnosis present

## 2024-04-03 DIAGNOSIS — R64 Cachexia: Secondary | ICD-10-CM | POA: Diagnosis present

## 2024-04-03 DIAGNOSIS — Z833 Family history of diabetes mellitus: Secondary | ICD-10-CM

## 2024-04-03 DIAGNOSIS — Z823 Family history of stroke: Secondary | ICD-10-CM | POA: Diagnosis not present

## 2024-04-03 DIAGNOSIS — Z7189 Other specified counseling: Secondary | ICD-10-CM

## 2024-04-03 DIAGNOSIS — G9341 Metabolic encephalopathy: Secondary | ICD-10-CM | POA: Diagnosis present

## 2024-04-03 DIAGNOSIS — E872 Acidosis, unspecified: Secondary | ICD-10-CM | POA: Diagnosis present

## 2024-04-03 DIAGNOSIS — F0390 Unspecified dementia without behavioral disturbance: Secondary | ICD-10-CM

## 2024-04-03 DIAGNOSIS — T17908S Unspecified foreign body in respiratory tract, part unspecified causing other injury, sequela: Secondary | ICD-10-CM | POA: Diagnosis not present

## 2024-04-03 DIAGNOSIS — Z711 Person with feared health complaint in whom no diagnosis is made: Secondary | ICD-10-CM

## 2024-04-03 DIAGNOSIS — F0284 Dementia in other diseases classified elsewhere, unspecified severity, with anxiety: Secondary | ICD-10-CM | POA: Diagnosis present

## 2024-04-03 DIAGNOSIS — E278 Other specified disorders of adrenal gland: Secondary | ICD-10-CM | POA: Diagnosis present

## 2024-04-03 DIAGNOSIS — R6521 Severe sepsis with septic shock: Secondary | ICD-10-CM | POA: Diagnosis present

## 2024-04-03 DIAGNOSIS — T17908A Unspecified foreign body in respiratory tract, part unspecified causing other injury, initial encounter: Secondary | ICD-10-CM

## 2024-04-03 DIAGNOSIS — A419 Sepsis, unspecified organism: Principal | ICD-10-CM | POA: Diagnosis present

## 2024-04-03 DIAGNOSIS — Z515 Encounter for palliative care: Secondary | ICD-10-CM

## 2024-04-03 HISTORY — DX: Unspecified dementia, unspecified severity, without behavioral disturbance, psychotic disturbance, mood disturbance, and anxiety: F03.90

## 2024-04-03 LAB — CBC WITH DIFFERENTIAL/PLATELET
Abs Immature Granulocytes: 0.1 K/uL — ABNORMAL HIGH (ref 0.00–0.07)
Basophils Absolute: 0 K/uL (ref 0.0–0.1)
Basophils Relative: 0 %
Eosinophils Absolute: 0 K/uL (ref 0.0–0.5)
Eosinophils Relative: 0 %
HCT: 39.7 % (ref 39.0–52.0)
Hemoglobin: 12.5 g/dL — ABNORMAL LOW (ref 13.0–17.0)
Immature Granulocytes: 1 %
Lymphocytes Relative: 6 %
Lymphs Abs: 0.9 K/uL (ref 0.7–4.0)
MCH: 26.9 pg (ref 26.0–34.0)
MCHC: 31.5 g/dL (ref 30.0–36.0)
MCV: 85.6 fL (ref 80.0–100.0)
Monocytes Absolute: 1.2 K/uL — ABNORMAL HIGH (ref 0.1–1.0)
Monocytes Relative: 7 %
Neutro Abs: 14.4 K/uL — ABNORMAL HIGH (ref 1.7–7.7)
Neutrophils Relative %: 86 %
Platelets: 216 K/uL (ref 150–400)
RBC: 4.64 MIL/uL (ref 4.22–5.81)
RDW: 16.2 % — ABNORMAL HIGH (ref 11.5–15.5)
WBC: 16.6 K/uL — ABNORMAL HIGH (ref 4.0–10.5)
nRBC: 0 % (ref 0.0–0.2)

## 2024-04-03 LAB — URINALYSIS, W/ REFLEX TO CULTURE (INFECTION SUSPECTED)
Bilirubin Urine: NEGATIVE
Glucose, UA: NEGATIVE mg/dL
Ketones, ur: NEGATIVE mg/dL
Nitrite: NEGATIVE
Protein, ur: 100 mg/dL — AB
RBC / HPF: >50 RBC/hpf (ref 0–5)
Specific Gravity, Urine: 1.01 (ref 1.005–1.030)
WBC, UA: >50 WBC/hpf (ref 0–5)
pH: 8 (ref 5.0–8.0)

## 2024-04-03 LAB — I-STAT CG4 LACTIC ACID, ED
Lactic Acid, Venous: 7.6 mmol/L (ref 0.5–1.9)
Lactic Acid, Venous: 6.9 mmol/L (ref 0.5–1.9)
Lactic Acid, Venous: 2.2 mmol/L (ref 0.5–1.9)

## 2024-04-03 LAB — COMPREHENSIVE METABOLIC PANEL WITH GFR
ALT: 16 U/L (ref 0–44)
AST: 33 U/L (ref 15–41)
Albumin: 3.1 g/dL — ABNORMAL LOW (ref 3.5–5.0)
Alkaline Phosphatase: 59 U/L (ref 38–126)
Anion gap: 19 — ABNORMAL HIGH (ref 5–15)
BUN: 32 mg/dL — ABNORMAL HIGH (ref 8–23)
CO2: 17 mmol/L — ABNORMAL LOW (ref 22–32)
Calcium: 9.4 mg/dL (ref 8.9–10.3)
Chloride: 110 mmol/L (ref 98–111)
Creatinine, Ser: 3.11 mg/dL — ABNORMAL HIGH (ref 0.61–1.24)
GFR, Estimated: 20 mL/min — ABNORMAL LOW (ref >60)
Glucose, Bld: 122 mg/dL — ABNORMAL HIGH (ref 70–99)
Potassium: 4.8 mmol/L (ref 3.5–5.1)
Sodium: 146 mmol/L — ABNORMAL HIGH (ref 135–145)
Total Bilirubin: 1.4 mg/dL — ABNORMAL HIGH (ref 0.0–1.2)
Total Protein: 7 g/dL (ref 6.5–8.1)

## 2024-04-03 LAB — CORTISOL: Cortisol, Plasma: 43.9 ug/dL

## 2024-04-03 LAB — PROTIME-INR
INR: 1.2 (ref 0.8–1.2)
Prothrombin Time: 15.9 s — ABNORMAL HIGH (ref 11.4–15.2)

## 2024-04-03 LAB — MRSA NEXT GEN BY PCR, NASAL: MRSA by PCR Next Gen: NOT DETECTED

## 2024-04-03 MED ORDER — PROCHLORPERAZINE EDISYLATE 10 MG/2ML IJ SOLN: 5.0000 mg | Freq: Four times a day (QID) | INTRAMUSCULAR | Status: DC | PRN

## 2024-04-03 MED ORDER — ALBUMIN HUMAN 25 % IV SOLN
25.0000 g | Freq: Four times a day (QID) | INTRAVENOUS | Status: DC
  Administered 2024-04-04: 12.5 g via INTRAVENOUS
  Administered 2024-04-04: 25 g via INTRAVENOUS
  Filled 2024-04-03 (×2): qty 100

## 2024-04-03 MED ORDER — SODIUM CHLORIDE 0.9 % IV SOLN
500.0000 mg | INTRAVENOUS | Status: DC
  Administered 2024-04-03: 500 mg via INTRAVENOUS
  Filled 2024-04-03: qty 5

## 2024-04-03 MED ORDER — SODIUM CHLORIDE 0.9 % IV BOLUS
1000.0000 mL | Freq: Once | INTRAVENOUS | Status: AC
  Administered 2024-04-03: 1000 mL via INTRAVENOUS

## 2024-04-03 MED ORDER — VANCOMYCIN HCL IN DEXTROSE 1-5 GM/200ML-% IV SOLN
1000.0000 mg | Freq: Once | INTRAVENOUS | Status: AC
  Administered 2024-04-03: 1000 mg via INTRAVENOUS
  Filled 2024-04-03: qty 200

## 2024-04-03 MED ORDER — ACETAMINOPHEN 650 MG RE SUPP: 650.0000 mg | Freq: Four times a day (QID) | RECTAL | Status: DC | PRN

## 2024-04-03 MED ORDER — SODIUM CHLORIDE 0.9 % IV SOLN: INTRAVENOUS | Status: AC

## 2024-04-03 MED ORDER — ACETAMINOPHEN 325 MG PO TABS
650.0000 mg | ORAL_TABLET | Freq: Four times a day (QID) | ORAL | Status: DC | PRN
Start: 2024-04-03 — End: 2024-04-05

## 2024-04-03 MED ORDER — SODIUM CHLORIDE 0.9% FLUSH
3.0000 mL | Freq: Two times a day (BID) | INTRAVENOUS | Status: DC
  Administered 2024-04-03 – 2024-04-06 (×6): 3 mL via INTRAVENOUS

## 2024-04-03 MED ORDER — VANCOMYCIN HCL 750 MG/150ML IV SOLN: 750.0000 mg | INTRAVENOUS | Status: DC

## 2024-04-03 MED ORDER — ALBUMIN HUMAN 25 % IV SOLN
25.0000 g | Freq: Four times a day (QID) | INTRAVENOUS | Status: DC
  Administered 2024-04-03: 25 g via INTRAVENOUS
  Filled 2024-04-03: qty 100

## 2024-04-03 MED ORDER — HEPARIN SODIUM (PORCINE) 5000 UNIT/ML IJ SOLN
5000.0000 [IU] | Freq: Three times a day (TID) | INTRAMUSCULAR | Status: DC
  Administered 2024-04-03 – 2024-04-04 (×2): 5000 [IU] via SUBCUTANEOUS
  Filled 2024-04-03 (×2): qty 1

## 2024-04-03 MED ORDER — SODIUM CHLORIDE 0.9 % IV SOLN
1.0000 g | INTRAVENOUS | Status: DC
  Administered 2024-04-04: 1 g via INTRAVENOUS
  Filled 2024-04-03 (×2): qty 10

## 2024-04-03 MED ORDER — LACTATED RINGERS IV BOLUS
500.0000 mL | Freq: Once | INTRAVENOUS | Status: AC
  Administered 2024-04-03: 500 mL via INTRAVENOUS

## 2024-04-03 MED ORDER — CHLORHEXIDINE GLUCONATE CLOTH 2 % EX PADS
6.0000 | MEDICATED_PAD | Freq: Every day | CUTANEOUS | Status: DC
  Administered 2024-04-03 – 2024-04-04 (×2): 6 via TOPICAL

## 2024-04-03 MED ORDER — PIPERACILLIN-TAZOBACTAM 3.375 G IVPB 30 MIN
3.3750 g | Freq: Once | INTRAVENOUS | Status: AC
  Administered 2024-04-03: 3.375 g via INTRAVENOUS
  Filled 2024-04-03: qty 50

## 2024-04-03 NOTE — Progress Notes (Signed)
 Pharmacy Antibiotic Note  Eric Bennett is a 78 y.o. male admitted on 04/03/2024 with pneumonia and UTI.  Pharmacy has been consulted for vanc/cefepime  dosing.  Plan: Vancomycin  1g x 1 then 750mg  IV q48 goal AUC 400-550 Cefepime  1g q24 F/up MRSA PCR result  Height: 5' 11 (180.3 cm) Weight: 64 kg (141 lb 1.5 oz) IBW/kg (Calculated) : 75.3  Temp (24hrs), Avg:98.9 F (37.2 C), Min:98.9 F (37.2 C), Max:98.9 F (37.2 C)  Recent Labs  Lab 04/03/24 1531 04/03/24 1539 04/03/24 1740  WBC  --  16.6*  --   CREATININE  --  3.11*  --   LATICACIDVEN 7.6*  --  6.9*    Estimated Creatinine Clearance: 17.7 mL/min (A) (by C-G formula based on SCr of 3.11 mg/dL (H)).    No Known Allergies   Thank you for allowing pharmacy to be a part of this patient's care.  Eric Bennett 04/03/2024 7:55 PM

## 2024-04-03 NOTE — Progress Notes (Signed)
 ED Pharmacy Antibiotic Sign Off An antibiotic consult was received from an ED provider for vancomycin  per pharmacy dosing for PNA. A chart review was completed to assess appropriateness.   The following one time order(s) were placed:  Vancomycin  1g  Further antibiotic and/or antibiotic pharmacy consults should be ordered by the admitting provider if indicated.   Thank you for allowing pharmacy to be a part of this patient's care.   [name], [facility]  Clinical Pharmacist 04/03/24 6:51 PM

## 2024-04-03 NOTE — ED Triage Notes (Addendum)
 Patient BIB [facility] from [facility]. Increased weakness and malaise for 4 days. Staff said urine was chunky. Has a foley catheter and dementia.   EMS HR 112 650mg  tylenol  80s systolic BP 18G left forearm normal saline

## 2024-04-03 NOTE — H&P (Addendum)
 History and Physical    Eric Bennett FMW:[id number] DOB: 1945-12-18 DOA: 04/03/2024  PCP: Cloria Annabella CROME, DO   Patient coming from: [facility]   Chief Complaint: General weakness, lethargy, purulent urine   HPI: Eric Bennett is a 78 y.o. male with medical history significant for hypertension, hyperlipidemia, Parkinson disease, and dementia who was sent from his nursing facility for lethargy and purulent urine.  The patient is unable to provide reliable history due to his dementia and acute illness.    He was found to be tachycardic with SBP in the 80s with EMS and 1200 mL of NS was administered prior to arrival in the ED.  ED Course: Upon arrival to the ED, patient is found to be afebrile and saturating well on room air with normal HR and SBP 70s to 80s initially.  Labs are most notable for creatinine 3.11, serum bicarbonate 17, WBC 16,600, and lactic acid 7.6.  CT is concerning for bilateral lower lobe pneumonia or aspiration, severe cystitis, proctocolitis, and left adrenal nodule.  Blood and urine cultures were collected in the ED and the patient was given 2.5 L of IVF, vancomycin , and Zosyn .  Review of Systems:  ROS limited by patient's clinical condition.  Past Medical History:  Diagnosis Date   Bilateral cataracts    Dementia (HCC)    Hypercholesteremia    Hypertension    IDA (iron deficiency anemia)    Memory loss 06/04/2020   Oropharyngeal dysphagia    Parkinson disease (HCC)    Primary open angle glaucoma (POAG) of both eyes     Past Surgical History:  Procedure Laterality Date   BIOPSY  12/07/2020   Procedure: BIOPSY;  Surgeon: Legrand Victory CROME DOUGLAS, MD;  Location: [facility];  Service: Gastroenterology;;   CATARACT EXTRACTION, BILATERAL     COLONOSCOPY WITH PROPOFOL  N/A 12/07/2020   Procedure: COLONOSCOPY WITH PROPOFOL ;  Surgeon: Legrand Victory CROME DOUGLAS, MD;  Location: [facility];  Service: Gastroenterology;  Laterality: N/A;   ESOPHAGOGASTRODUODENOSCOPY (EGD) WITH  PROPOFOL  N/A 12/07/2020   Procedure: ESOPHAGOGASTRODUODENOSCOPY (EGD) WITH PROPOFOL ;  Surgeon: Legrand Victory CROME DOUGLAS, MD;  Location: [facility];  Service: Gastroenterology;  Laterality: N/A;   HEMOSTASIS CLIP PLACEMENT  12/07/2020   Procedure: HEMOSTASIS CLIP PLACEMENT;  Surgeon: Legrand Victory CROME DOUGLAS, MD;  Location: [facility];  Service: Gastroenterology;;   POLYPECTOMY  12/07/2020   Procedure: POLYPECTOMY;  Surgeon: Legrand Victory CROME DOUGLAS, MD;  Location: [facility];  Service: Gastroenterology;;    Social History:   reports that he has never smoked. He has never used smokeless tobacco. He reports that he does not currently use alcohol . He reports that he does not use drugs.  No Known Allergies  Family History  Problem Relation Age of Onset   Stroke Mother    Diabetes Mother    Other Father        brain tumor - not sure if is was cancerous     Prior to Admission medications   Medication Sig Start Date End Date Taking? Authorizing Provider  atorvastatin (LIPITOR) 40 MG tablet Take 40 mg by mouth at bedtime. 03/28/18   [provider]  brimonidine (ALPHAGAN) 0.2 % ophthalmic solution Place 1 drop into both eyes 2 (two) times daily.    [provider]  carbidopa -levodopa  (SINEMET  CR) 50-200 MG tablet At 8am, 2pm 02/21/22   Onita Duos, MD  carboxymethylcellulose (REFRESH PLUS) 0.5 % SOLN Place 2 drops into both eyes daily as needed.    [provider]  cholecalciferol (VITAMIN D3) 25 MCG (1000 UNIT) tablet Take 1,000 Units by mouth daily.    [provider]  Cyanocobalamin (VITAMIN B 12 PO) Take 1,000 mcg by mouth daily.    [provider]  dorzolamide-timolol (COSOPT) 22.3-6.8 MG/ML ophthalmic solution Place 1 drop into both eyes 2 (two) times daily.    [provider]  Iron-Vitamin C (VITRON-C PO) Take 1 tablet by mouth daily.    [provider]  metoprolol succinate (TOPROL-XL) 100 MG 24 hr tablet Take 100 mg by mouth daily. Take  with or immediately following a meal.    [provider]  mirtazapine (REMERON SOL-TAB) 15 MG disintegrating tablet Take 15 mg by mouth at bedtime.    [provider]  omeprazole (PRILOSEC) 20 MG capsule Take 20 mg by mouth daily.    [provider]  polyethylene glycol (MIRALAX / GLYCOLAX) 17 g packet Take 17 g by mouth 3 (three) times a week.    [provider]  potassium chloride SA (KLOR-CON) 20 MEQ tablet Take 20 mEq by mouth daily.    [provider]  rasagiline  (AZILECT ) 1 MG TABS tablet Take 1 tablet (1 mg total) by mouth daily. 02/21/22   Onita Duos, MD  ROCKLATAN 0.02-0.005 % SOLN Apply 1 drop to eye daily. 10/13/22   [provider]    Physical Exam: Vitals:   04/03/24 1745 04/03/24 1800 04/03/24 1845 04/03/24 1915  BP: (!) 100/55 (!) 97/53 (!) 101/53 (!) 85/50  Pulse: 80 80 80 76  Resp:  14    Temp:      TempSrc:      SpO2: 99% 99% 97% 97%  Weight:      Height:        Constitutional: NAD, sleeping  Eyes: PERTLA, lids and conjunctivae normal ENMT: Mucous membranes are moist. Posterior pharynx clear of any exudate or lesions.   Neck: supple, no masses  Respiratory: Rhonchi bilaterally, no wheezing. No accessory muscle use.  Cardiovascular: S1 & S2 heard, regular rate and rhythm. No extremity edema.  Abdomen: No tenderness, soft. Bowel sounds active.  Musculoskeletal: no clubbing / cyanosis. No joint deformity upper and lower extremities.   Skin: no significant rashes, lesions, ulcers. Warm, dry, well-perfused. Neurologic: CN 2-12 grossly intact. Moves all extremities. Sleeping, wakes and opens eyes briefly to loud voice.    Labs and Imaging on Admission: I have personally reviewed following labs and imaging studies  CBC: Recent Labs  Lab 04/03/24 1539  WBC 16.6*  NEUTROABS 14.4*  HGB 12.5*  HCT 39.7  MCV 85.6  PLT 216   Basic Metabolic Panel: Recent Labs  Lab 04/03/24 1539  NA 146*  K 4.8  CL 110  CO2  17*  GLUCOSE 122*  BUN 32*  CREATININE 3.11*  CALCIUM 9.4   GFR: Estimated Creatinine Clearance: 17.7 mL/min (A) (by C-G formula based on SCr of 3.11 mg/dL (H)). Liver Function Tests: Recent Labs  Lab 04/03/24 1539  AST 33  ALT 16  ALKPHOS 59  BILITOT 1.4*  PROT 7.0  ALBUMIN  3.1*   No results for input(s): LIPASE, AMYLASE in the last 168 hours. No results for input(s): AMMONIA in the last 168 hours. Coagulation Profile: Recent Labs  Lab 04/03/24 1539  INR 1.2   Cardiac Enzymes: No results for input(s): CKTOTAL, CKMB, CKMBINDEX, TROPONINI in the last 168 hours. BNP (last 3 results) No results for input(s): PROBNP in the last 8760 hours. HbA1C: No results for input(s): HGBA1C in the  last 72 hours. CBG: No results for input(s): GLUCAP in the last 168 hours. Lipid Profile: No results for input(s): CHOL, HDL, LDLCALC, TRIG, CHOLHDL, LDLDIRECT in the last 72 hours. Thyroid Function Tests: No results for input(s): TSH, T4TOTAL, FREET4, T3FREE, THYROIDAB in the last 72 hours. Anemia Panel: No results for input(s): VITAMINB12, FOLATE, FERRITIN, TIBC, IRON, RETICCTPCT in the last 72 hours. Urine analysis:    Component Value Date/Time   COLORURINE YELLOW 04/03/2024 1603   APPEARANCEUR TURBID (A) 04/03/2024 1603   LABSPEC 1.010 04/03/2024 1603   PHURINE 8.0 04/03/2024 1603   GLUCOSEU NEGATIVE 04/03/2024 1603   HGBUR MODERATE (A) 04/03/2024 1603   BILIRUBINUR NEGATIVE 04/03/2024 1603   KETONESUR NEGATIVE 04/03/2024 1603   PROTEINUR 100 (A) 04/03/2024 1603   NITRITE NEGATIVE 04/03/2024 1603   LEUKOCYTESUR LARGE (A) 04/03/2024 1603   Sepsis Labs: @LABRCNTIP (procalcitonin:4,lacticidven:4) ) Recent Results (from the past 240 hours)  Culture, blood (Routine x 2)     Status: None (Preliminary result)   Collection Time: 04/03/24  3:37 PM   Specimen: BLOOD RIGHT FOREARM  Result Value Ref Range Status   Specimen  Description   Final    BLOOD RIGHT FOREARM Performed at [facility], [address] [place], [place] [address]    Special Requests   Final    BOTTLES DRAWN AEROBIC AND ANAEROBIC Blood Culture adequate volume Performed at [facility], [address] [place], [place] [address]    Culture PENDING  Incomplete   Report Status PENDING  Incomplete     Radiological Exams on Admission: CT ABDOMEN PELVIS WO CONTRAST Result Date: 04/03/2024 EXAM: CT ABDOMEN AND PELVIS WITHOUT CONTRAST 04/03/2024 06:21:00 PM TECHNIQUE: CT of the abdomen and pelvis was performed without the administration of intravenous contrast. Multiplanar reformatted images are provided for review. Automated exposure control, iterative reconstruction, and/or weight based adjustment of the mA/kV was utilized to reduce the radiation dose to as low as reasonably achievable. COMPARISON: None available. CLINICAL HISTORY: Sepsis. Increased weakness and malaise for 4 days. Staff said urine was chunky. Has a foley catheter and dementia. FINDINGS: LOWER CHEST: Patchy airspace opacities in the bilateral lower lobes compatible with aspiration and/or pneumonia. LIVER: The liver is unremarkable. GALLBLADDER AND BILE DUCTS: Gallbladder is unremarkable. No biliary ductal dilatation. SPLEEN: No acute abnormality. PANCREAS: No acute abnormality. ADRENAL GLANDS: Intermediate density (Hounsfield unit 40) nodule in the left adrenal gland measuring 2.1 cm. KIDNEYS, URETERS AND BLADDER: Nonobstructing left nephrolithiasis. No hydronephrosis. Foley catheter in the markedly thick walled bladder. Gas is also present within the bladder. Marked perivesical fat stranding and fluid. GI AND BOWEL: Stomach demonstrates no acute abnormality. There is no bowel obstruction. Wall thickening of the distal sigmoid colon and rectum with adjacent stranding and fluid. PERITONEUM AND RETROPERITONEUM: No free intraperitoneal air. Free fluid and stranding  in the pelvis. VASCULATURE: Aortic atherosclerotic calcification. No aortic aneurysm. LYMPH NODES: No lymphadenopathy. REPRODUCTIVE ORGANS: No acute abnormality. BONES AND SOFT TISSUES: No acute fracture. No focal soft tissue abnormality. IMPRESSION: 1. Bilateral lower lobe pneumonia and/or aspiration. 2. Severe cystitis. 3. Proctocolitis of the distal sigmoid colon and rectum. 4. Enlarged prostate. 5. Intermediate density (Hounsfield unit 40) nodule in the left adrenal gland measuring 2.1 cm. Consider nonemergent adrenal protocol CT for further evaluation. Electronically signed by: Norman Gatlin MD 04/03/2024 06:37 PM EDT RP Workstation: [id number]   DG Chest Portable 1 View Result Date: 04/03/2024 CLINICAL DATA:  Sepsis EXAM: PORTABLE CHEST 1 VIEW COMPARISON:  Chest x-ray 08/19/2021  FINDINGS: The heart is enlarged. The lungs are clear. The aorta is ectatic. There is no pleural effusion or pneumothorax. No acute fractures are seen. IMPRESSION: Cardiomegaly. No acute cardiopulmonary process. Electronically Signed   By: Greig Pique M.D.   On: 04/03/2024 16:46    EKG: Independently reviewed. Sinus rhythm.   Assessment/Plan   1. Septic shock d/t UTI  - Pseudomonas grew from urine last month  - Continue broad-spectrum antibiotics, IVF, add albumin  q6h, trend lactate, follow cultures and clinical course    2. Acute renal failure; urinary retention  - No hydronephrosis on CT, likely d/t sepsis/hypotension  - Hold antihypertensives, continue IVF hydration, continue Foley catheter poa, renally-dose medications, repeat chem panel in am   3. Pneumonia  - CT suspicious for pneumonia or aspiration in the lung bases  - Keep NPO pending SLP eval, and azithromycin , continue supportive care   4. Colitis  - CT concerning for colitis  - Check C diff and GI pathogen panel    5. Parkinson disease; dementia  - Resume Sinemet  when able to safely take oral medications, use delirium precautions   6. Left  adrenal nodule  - Consider outpatient follow-up   7. Goals of Care  - Patient presents with DNR forms  - Discussed the situation with his POA, Vina Lesches, by phone who confirms that patient had told her he wanted to be DNR - We discussed that he is critically-ill and may not survive this illness despite our best efforts but that we would honor his wishes and try to get him through this with antibiotics, IVF,  and albumin       DVT prophylaxis: sq heparin   Code Status: DNR/DNI  Level of Care: Level of care: Stepdown Family Communication: Vina Lesches (niece, POA)  Disposition Plan:  Patient is from: [facility]   Anticipated d/c is to: TBD  Anticipated d/c date is: 04/07/24 Patient currently: Pending clinical course  Consults called: None  Admission status: Inpatient     Evalene GORMAN Sprinkles, MD [facility]  04/03/2024, 7:44 PM

## 2024-04-03 NOTE — ED Notes (Addendum)
 I stat Lactic acid 2.18 EDP [place] notified

## 2024-04-03 NOTE — ED Notes (Signed)
 Flushed foley catheter per EDP request.  Foley is draining well.

## 2024-04-03 NOTE — ED Provider Notes (Signed)
 [place] EMERGENCY DEPARTMENT AT [facility] Provider Note   CSN: [id number] Arrival date & time: 04/03/24  1512     Patient presents with: Code Sepsis   Eric Bennett is a 78 y.o. male.   This is a 78 year old male presenting emergency department with weakness and generalized malaise for the past several days.  He has dementia and cannot provide a reliable history.  Denies pain.          Prior to Admission medications   Medication Sig Start Date End Date Taking? Authorizing Provider  atorvastatin (LIPITOR) 40 MG tablet Take 40 mg by mouth at bedtime. 03/28/18   [provider]  brimonidine (ALPHAGAN) 0.2 % ophthalmic solution Place 1 drop into both eyes 2 (two) times daily.    [provider]  carbidopa -levodopa  (SINEMET  CR) 50-200 MG tablet At 8am, 2pm 02/21/22   Onita Duos, MD  carboxymethylcellulose (REFRESH PLUS) 0.5 % SOLN Place 2 drops into both eyes daily as needed.    [provider]  cholecalciferol (VITAMIN D3) 25 MCG (1000 UNIT) tablet Take 1,000 Units by mouth daily.    [provider]  Cyanocobalamin (VITAMIN B 12 PO) Take 1,000 mcg by mouth daily.    [provider]  dorzolamide-timolol (COSOPT) 22.3-6.8 MG/ML ophthalmic solution Place 1 drop into both eyes 2 (two) times daily.    [provider]  Iron-Vitamin C (VITRON-C PO) Take 1 tablet by mouth daily.    [provider]  metoprolol succinate (TOPROL-XL) 100 MG 24 hr tablet Take 100 mg by mouth daily. Take with or immediately following a meal.    [provider]  mirtazapine (REMERON SOL-TAB) 15 MG disintegrating tablet Take 15 mg by mouth at bedtime.    [provider]  omeprazole (PRILOSEC) 20 MG capsule Take 20 mg by mouth daily.    [provider]  polyethylene glycol (MIRALAX / GLYCOLAX) 17 g packet Take 17 g by mouth 3 (three) times a week.    [provider]  potassium chloride SA (KLOR-CON) 20 MEQ  tablet Take 20 mEq by mouth daily.    [provider]  rasagiline  (AZILECT ) 1 MG TABS tablet Take 1 tablet (1 mg total) by mouth daily. 02/21/22   Onita Duos, MD  ROCKLATAN 0.02-0.005 % SOLN Apply 1 drop to eye daily. 10/13/22   [provider]    Allergies: Patient has no known allergies.    Review of Systems  Updated Vital Signs BP (!) 97/55   Pulse 72   Temp 98.9 F (37.2 C) (Oral)   Resp 14   Ht 5' 11 (1.803 m)   Wt 64 kg   SpO2 96%   BMI 19.68 kg/m   Physical Exam Vitals and nursing note reviewed.  Constitutional:      General: He is not in acute distress. HENT:     Head: Normocephalic.     Nose: Nose normal.     Mouth/Throat:     Mouth: Mucous membranes are dry.  Eyes:     Conjunctiva/sclera: Conjunctivae normal.  Cardiovascular:     Rate and Rhythm: Normal rate and regular rhythm.  Pulmonary:     Effort: Pulmonary effort is normal.  Abdominal:     General: Abdomen is flat.     Palpations: Abdomen is soft.     Tenderness: There is no abdominal tenderness.  Genitourinary:    Comments: Foley in place.  Appears to have frank pus. Musculoskeletal:  General: Normal range of motion.  Skin:    General: Skin is warm.     Capillary Refill: Capillary refill takes less than 2 seconds.  Neurological:     Mental Status: He is oriented to person, place, and time.  Psychiatric:        Mood and Affect: Mood normal.        Behavior: Behavior normal.     (all labs ordered are listed, but only abnormal results are displayed) Labs Reviewed  COMPREHENSIVE METABOLIC PANEL WITH GFR - Abnormal; Notable for the following components:      Result Value   Sodium 146 (*)    CO2 17 (*)    Glucose, Bld 122 (*)    BUN 32 (*)    Creatinine, Ser 3.11 (*)    Albumin  3.1 (*)    Total Bilirubin 1.4 (*)    GFR, Estimated 20 (*)    Anion gap 19 (*)    All other components within normal limits  CBC WITH DIFFERENTIAL/PLATELET - Abnormal; Notable for the  following components:   WBC 16.6 (*)    Hemoglobin 12.5 (*)    RDW 16.2 (*)    Neutro Abs 14.4 (*)    Monocytes Absolute 1.2 (*)    Abs Immature Granulocytes 0.10 (*)    All other components within normal limits  PROTIME-INR - Abnormal; Notable for the following components:   Prothrombin Time 15.9 (*)    All other components within normal limits  URINALYSIS, W/ REFLEX TO CULTURE (INFECTION SUSPECTED) - Abnormal; Notable for the following components:   APPearance TURBID (*)    Hgb urine dipstick MODERATE (*)    Protein, ur 100 (*)    Leukocytes,Ua LARGE (*)    Bacteria, UA MANY (*)    All other components within normal limits  I-STAT CG4 LACTIC ACID, ED - Abnormal; Notable for the following components:   Lactic Acid, Venous 7.6 (*)    All other components within normal limits  I-STAT CG4 LACTIC ACID, ED - Abnormal; Notable for the following components:   Lactic Acid, Venous 6.9 (*)    All other components within normal limits  CULTURE, BLOOD (ROUTINE X 2)  CULTURE, BLOOD (ROUTINE X 2)  URINE CULTURE  C DIFFICILE QUICK SCREEN W PCR REFLEX    GASTROINTESTINAL PANEL BY PCR, STOOL (REPLACES STOOL CULTURE)  MRSA NEXT GEN BY PCR, NASAL  BASIC METABOLIC PANEL WITH GFR  HEPATIC FUNCTION PANEL  CBC  CORTISOL  I-STAT CG4 LACTIC ACID, ED    EKG: EKG Interpretation Date/Time:  Wednesday April 03 2024 15:21:47 EDT Ventricular Rate:  93 PR Interval:  133 QRS Duration:  83 QT Interval:  364 QTC Calculation: 453 R Axis:   2  Text Interpretation: Sinus rhythm ST depr, consider ischemia, inferior leads Minimal ST elevation, anterior leads Confirmed by Neysa Clap [phone]) on 04/03/2024 4:47:30 PM  Radiology: CT ABDOMEN PELVIS WO CONTRAST Result Date: 04/03/2024 EXAM: CT ABDOMEN AND PELVIS WITHOUT CONTRAST 04/03/2024 06:21:00 PM TECHNIQUE: CT of the abdomen and pelvis was performed without the administration of intravenous contrast. Multiplanar reformatted images are provided for  review. Automated exposure control, iterative reconstruction, and/or weight based adjustment of the mA/kV was utilized to reduce the radiation dose to as low as reasonably achievable. COMPARISON: None available. CLINICAL HISTORY: Sepsis. Increased weakness and malaise for 4 days. Staff said urine was chunky. Has a foley catheter and dementia. FINDINGS: LOWER CHEST: Patchy airspace opacities in the bilateral lower lobes compatible with aspiration  and/or pneumonia. LIVER: The liver is unremarkable. GALLBLADDER AND BILE DUCTS: Gallbladder is unremarkable. No biliary ductal dilatation. SPLEEN: No acute abnormality. PANCREAS: No acute abnormality. ADRENAL GLANDS: Intermediate density (Hounsfield unit 40) nodule in the left adrenal gland measuring 2.1 cm. KIDNEYS, URETERS AND BLADDER: Nonobstructing left nephrolithiasis. No hydronephrosis. Foley catheter in the markedly thick walled bladder. Gas is also present within the bladder. Marked perivesical fat stranding and fluid. GI AND BOWEL: Stomach demonstrates no acute abnormality. There is no bowel obstruction. Wall thickening of the distal sigmoid colon and rectum with adjacent stranding and fluid. PERITONEUM AND RETROPERITONEUM: No free intraperitoneal air. Free fluid and stranding in the pelvis. VASCULATURE: Aortic atherosclerotic calcification. No aortic aneurysm. LYMPH NODES: No lymphadenopathy. REPRODUCTIVE ORGANS: No acute abnormality. BONES AND SOFT TISSUES: No acute fracture. No focal soft tissue abnormality. IMPRESSION: 1. Bilateral lower lobe pneumonia and/or aspiration. 2. Severe cystitis. 3. Proctocolitis of the distal sigmoid colon and rectum. 4. Enlarged prostate. 5. Intermediate density (Hounsfield unit 40) nodule in the left adrenal gland measuring 2.1 cm. Consider nonemergent adrenal protocol CT for further evaluation. Electronically signed by: Norman Gatlin MD 04/03/2024 06:37 PM EDT RP Workstation: [id number]   DG Chest Portable 1 View Result  Date: 04/03/2024 CLINICAL DATA:  Sepsis EXAM: PORTABLE CHEST 1 VIEW COMPARISON:  Chest x-ray 08/19/2021 FINDINGS: The heart is enlarged. The lungs are clear. The aorta is ectatic. There is no pleural effusion or pneumothorax. No acute fractures are seen. IMPRESSION: Cardiomegaly. No acute cardiopulmonary process. Electronically Signed   By: Greig Pique M.D.   On: 04/03/2024 16:46     .Critical Care  Performed by: Neysa Caron PARAS, DO Authorized by: Neysa Caron PARAS, DO   Critical care provider statement:    Critical care time (minutes):  30   Critical care was necessary to treat or prevent imminent or life-threatening deterioration of the following conditions:  Sepsis   Critical care was time spent personally by me on the following activities:  Development of treatment plan with patient or surrogate, discussions with consultants, evaluation of patient's response to treatment, examination of patient, ordering and review of laboratory studies, ordering and review of radiographic studies, ordering and performing treatments and interventions, pulse oximetry, re-evaluation of patient's condition and review of old charts    Medications Ordered in the ED  albumin  human 25 % solution 25 g (has no administration in time range)  heparin  injection 5,000 Units (has no administration in time range)  sodium chloride  flush (NS) 0.9 % injection 3 mL (has no administration in time range)  acetaminophen  (TYLENOL ) tablet 650 mg (has no administration in time range)    Or  acetaminophen  (TYLENOL ) suppository 650 mg (has no administration in time range)  0.9 %  sodium chloride  infusion (has no administration in time range)  prochlorperazine  (COMPAZINE ) injection 5 mg (has no administration in time range)  azithromycin  (ZITHROMAX ) 500 mg in sodium chloride  0.9 % 250 mL IVPB (has no administration in time range)  ceFEPIme  (MAXIPIME ) 1 g in sodium chloride  0.9 % 100 mL IVPB (has no administration in time range)   vancomycin  (VANCOREADY) IVPB 750 mg/150 mL (has no administration in time range)  sodium chloride  0.9 % bolus 1,000 mL (0 mLs Intravenous Stopped 04/03/24 1637)  piperacillin -tazobactam (ZOSYN ) IVPB 3.375 g (0 g Intravenous Stopped 04/03/24 1612)  sodium chloride  0.9 % bolus 1,000 mL (0 mLs Intravenous Stopped 04/03/24 1637)  lactated ringers  bolus 500 mL (0 mLs Intravenous Stopped 04/03/24 1730)  vancomycin  (VANCOCIN ) IVPB 1000 mg/200  mL premix (0 mg Intravenous Stopped 04/03/24 2003)    Clinical Course as of 04/03/24 2006  Wed Apr 03, 2024  1617 BP 107/53 [TY]  1622 Appears he was admitted in May for urinary tract infection and had urinary retention at that time. [TY]  1651 DG Chest Portable 1 View IMPRESSION: Cardiomegaly. No acute cardiopulmonary process.   Electronically Signed   By: Greig Pique M.D.   On: 04/03/2024 16:46   [TY]    Clinical Course User Index [TY] Neysa Caron PARAS, DO                                 Medical Decision Making This is a 78 year old male with Parkinson's, dementia, hypertension, hyperlipidemia chronic indwelling Foley presenting emergency department with generalized malaise.  He is afebrile, however was tachycardic and tachypneic with EMS, received roughly a liter of fluid with EMS.  Patient clinically appears dry.  Has Foley catheter in place with what appears to be frank pus in tubing.  Benign abdominal exam.  He is slightly tachypneic, does appear to be in respiratory distress.  Soft blood pressures 87/56 initially, improved to 107/50 2:03 liters of fluids.  Foley catheter changed.  Started on empiric antibiotics Zosyn .  Labs concerning for sepsis leukocytosis of 16.6.  Lactate of 7.6.  Appears to have AKI with elevation in his BUN and creatinine.  Also has anion gap which I suspect is secondary to his elevated lactate.  UA is concerning for UTI.  Given patient's vital signs and lab work will admit for further antibiotics for sepsis secondary to  complicated UTI.  Amount and/or Complexity of Data Reviewed Independent Historian: EMS    Details: Symptoms reported giving 650 of Tylenol  prior to arrival as well as close to a liter of IV fluids. External Data Reviewed:     Details: See ED course Labs: ordered.    Details: See above Radiology: ordered and independent interpretation performed. Decision-making details documented in ED Course.    Details: X-ray reviewed, does not appear to have overt pneumonia on my independent review of images. ECG/medicine tests: independent interpretation performed.    Details: Appears to be sinus rhythm.  No STEMI Discussion of management or test interpretation with external provider(s): Hospitalist for admission.   Risk Prescription drug management. Decision regarding hospitalization. Diagnosis or treatment significantly limited by social determinants of health. Risk Details: Patient is a DNR.  Does reside in an assisted living facility.      Final diagnoses:  Sepsis with acute renal failure and septic shock, due to unspecified organism, unspecified acute renal failure type [facility])    ED Discharge Orders     None          Neysa Caron PARAS, DO 04/03/24 2006

## 2024-04-04 ENCOUNTER — Inpatient Hospital Stay (HOSPITAL_COMMUNITY): Payer: Medicare (Managed Care)

## 2024-04-04 DIAGNOSIS — Z79899 Other long term (current) drug therapy: Secondary | ICD-10-CM | POA: Diagnosis not present

## 2024-04-04 DIAGNOSIS — Z711 Person with feared health complaint in whom no diagnosis is made: Secondary | ICD-10-CM

## 2024-04-04 DIAGNOSIS — K529 Noninfective gastroenteritis and colitis, unspecified: Secondary | ICD-10-CM | POA: Diagnosis not present

## 2024-04-04 DIAGNOSIS — A419 Sepsis, unspecified organism: Secondary | ICD-10-CM | POA: Diagnosis not present

## 2024-04-04 DIAGNOSIS — J9601 Acute respiratory failure with hypoxia: Secondary | ICD-10-CM

## 2024-04-04 DIAGNOSIS — Z515 Encounter for palliative care: Secondary | ICD-10-CM | POA: Diagnosis not present

## 2024-04-04 DIAGNOSIS — N179 Acute kidney failure, unspecified: Secondary | ICD-10-CM | POA: Diagnosis not present

## 2024-04-04 DIAGNOSIS — T17908A Unspecified foreign body in respiratory tract, part unspecified causing other injury, initial encounter: Secondary | ICD-10-CM

## 2024-04-04 DIAGNOSIS — Z7189 Other specified counseling: Secondary | ICD-10-CM

## 2024-04-04 DIAGNOSIS — F0390 Unspecified dementia without behavioral disturbance: Secondary | ICD-10-CM

## 2024-04-04 DIAGNOSIS — Z66 Do not resuscitate: Secondary | ICD-10-CM

## 2024-04-04 DIAGNOSIS — N3000 Acute cystitis without hematuria: Secondary | ICD-10-CM | POA: Diagnosis not present

## 2024-04-04 DIAGNOSIS — T17908S Unspecified foreign body in respiratory tract, part unspecified causing other injury, sequela: Secondary | ICD-10-CM

## 2024-04-04 LAB — BASIC METABOLIC PANEL WITH GFR
Anion gap: 10 (ref 5–15)
BUN: 29 mg/dL — ABNORMAL HIGH (ref 8–23)
CO2: 20 mmol/L — ABNORMAL LOW (ref 22–32)
Calcium: 8.9 mg/dL (ref 8.9–10.3)
Chloride: 112 mmol/L — ABNORMAL HIGH (ref 98–111)
Creatinine, Ser: 1.41 mg/dL — ABNORMAL HIGH (ref 0.61–1.24)
GFR, Estimated: 51 mL/min — ABNORMAL LOW (ref >60)
Glucose, Bld: 112 mg/dL — ABNORMAL HIGH (ref 70–99)
Potassium: 3.8 mmol/L (ref 3.5–5.1)
Sodium: 142 mmol/L (ref 135–145)

## 2024-04-04 LAB — BLOOD CULTURE ID PANEL (REFLEXED) - BCID2

## 2024-04-04 LAB — MAGNESIUM: Magnesium: 1.8 mg/dL (ref 1.7–2.4)

## 2024-04-04 LAB — CK: Total CK: 592 U/L — ABNORMAL HIGH (ref 49–397)

## 2024-04-04 LAB — HEPATIC FUNCTION PANEL
ALT: 19 U/L (ref 0–44)
AST: 31 U/L (ref 15–41)
Albumin: 3.4 g/dL — ABNORMAL LOW (ref 3.5–5.0)
Alkaline Phosphatase: 43 U/L (ref 38–126)
Bilirubin, Direct: 0.3 mg/dL — ABNORMAL HIGH (ref 0.0–0.2)
Indirect Bilirubin: 1.1 mg/dL — ABNORMAL HIGH (ref 0.3–0.9)
Total Bilirubin: 1.4 mg/dL — ABNORMAL HIGH (ref 0.0–1.2)
Total Protein: 6.3 g/dL — ABNORMAL LOW (ref 6.5–8.1)

## 2024-04-04 LAB — PHOSPHORUS: Phosphorus: 2.9 mg/dL (ref 2.5–4.6)

## 2024-04-04 LAB — CBC
HCT: 30.8 % — ABNORMAL LOW (ref 39.0–52.0)
Hemoglobin: 9.7 g/dL — ABNORMAL LOW (ref 13.0–17.0)
MCH: 26.8 pg (ref 26.0–34.0)
MCHC: 31.5 g/dL (ref 30.0–36.0)
MCV: 85.1 fL (ref 80.0–100.0)
Platelets: 153 K/uL (ref 150–400)
RBC: 3.62 MIL/uL — ABNORMAL LOW (ref 4.22–5.81)
RDW: 16 % — ABNORMAL HIGH (ref 11.5–15.5)
WBC: 12.8 K/uL — ABNORMAL HIGH (ref 4.0–10.5)
nRBC: 0 % (ref 0.0–0.2)

## 2024-04-04 LAB — LACTIC ACID, PLASMA: Lactic Acid, Venous: 1.8 mmol/L (ref 0.5–1.9)

## 2024-04-04 LAB — BRAIN NATRIURETIC PEPTIDE: B Natriuretic Peptide: 237.5 pg/mL — ABNORMAL HIGH (ref 0.0–100.0)

## 2024-04-04 MED ORDER — HYDROMORPHONE HCL-NACL 50-0.9 MG/50ML-% IV SOLN
1.0000 mg/h | INTRAVENOUS | Status: DC
  Administered 2024-04-04: 1 mg/h via INTRAVENOUS
  Administered 2024-04-04: 2.5 mg/h via INTRAVENOUS
  Administered 2024-04-05: 4.5 mg/h via INTRAVENOUS
  Administered 2024-04-05: 3 mg/h via INTRAVENOUS
  Administered 2024-04-06: 7 mg/h via INTRAVENOUS
  Administered 2024-04-06: 8 mg/h via INTRAVENOUS
  Administered 2024-04-06: 7.5 mg/h via INTRAVENOUS
  Administered 2024-04-06: 6.5 mg/h via INTRAVENOUS
  Administered 2024-04-06 (×2): 8.5 mg/h via INTRAVENOUS
  Filled 2024-04-04 (×8): qty 50

## 2024-04-04 MED ORDER — MORPHINE SULFATE (PF) 2 MG/ML IV SOLN: 1.0000 mg | INTRAVENOUS | Status: DC | PRN

## 2024-04-04 MED ORDER — LORAZEPAM 2 MG/ML IJ SOLN
0.5000 mg | INTRAMUSCULAR | Status: DC | PRN
  Filled 2024-04-04: qty 1

## 2024-04-04 MED ORDER — SODIUM CHLORIDE 0.9 % IV SOLN: INTRAVENOUS | Status: DC

## 2024-04-04 MED ORDER — HYDROMORPHONE BOLUS VIA INFUSION
0.5000 mg | INTRAVENOUS | Status: DC | PRN
  Administered 2024-04-04: 1 mg via INTRAVENOUS
  Administered 2024-04-04: 0.5 mg via INTRAVENOUS
  Administered 2024-04-04 (×4): 1 mg via INTRAVENOUS
  Administered 2024-04-04: 0.5 mg via INTRAVENOUS
  Administered 2024-04-04: 1 mg via INTRAVENOUS
  Administered 2024-04-05 (×2): 2 mg via INTRAVENOUS
  Administered 2024-04-05 (×2): 1 mg via INTRAVENOUS
  Administered 2024-04-05 (×5): 2 mg via INTRAVENOUS
  Administered 2024-04-05: 1 mg via INTRAVENOUS
  Administered 2024-04-05 (×2): 2 mg via INTRAVENOUS
  Administered 2024-04-05: 1 mg via INTRAVENOUS
  Administered 2024-04-05: 2 mg via INTRAVENOUS
  Administered 2024-04-05: 1 mg via INTRAVENOUS
  Administered 2024-04-05: 2 mg via INTRAVENOUS
  Administered 2024-04-05 (×2): 1 mg via INTRAVENOUS
  Administered 2024-04-05 (×4): 2 mg via INTRAVENOUS
  Administered 2024-04-06 (×11): 1 mg via INTRAVENOUS

## 2024-04-04 MED ORDER — HYDROMORPHONE HCL 1 MG/ML IJ SOLN
1.0000 mg | INTRAMUSCULAR | Status: DC | PRN
  Administered 2024-04-05: 2 mg via INTRAVENOUS
  Filled 2024-04-04: qty 2

## 2024-04-04 MED ORDER — GLYCOPYRROLATE 0.2 MG/ML IJ SOLN
0.2000 mg | INTRAMUSCULAR | Status: DC | PRN
  Administered 2024-04-04 – 2024-04-06 (×6): 0.2 mg via INTRAVENOUS
  Filled 2024-04-04 (×6): qty 1

## 2024-04-04 MED ORDER — IPRATROPIUM-ALBUTEROL 0.5-2.5 (3) MG/3ML IN SOLN: 3.0000 mL | RESPIRATORY_TRACT | Status: DC | PRN

## 2024-04-04 MED ORDER — MORPHINE SULFATE (PF) 2 MG/ML IV SOLN
0.5000 mg | INTRAVENOUS | Status: DC | PRN
  Administered 2024-04-04: 0.5 mg via INTRAVENOUS
  Filled 2024-04-04: qty 1

## 2024-04-04 MED ORDER — METHYLPREDNISOLONE SODIUM SUCC 125 MG IJ SOLR: 125.0000 mg | Freq: Once | INTRAMUSCULAR | Status: DC

## 2024-04-04 MED ORDER — ORAL CARE MOUTH RINSE
15.0000 mL | OROMUCOSAL | Status: DC | PRN
  Administered 2024-04-06: 15 mL via OROMUCOSAL

## 2024-04-04 MED ORDER — BIOTENE DRY MOUTH MT LIQD
15.0000 mL | OROMUCOSAL | Status: AC | PRN
Start: 2024-04-04 — End: ?

## 2024-04-04 MED ORDER — HALOPERIDOL LACTATE 5 MG/ML IJ SOLN
1.0000 mg | INTRAMUSCULAR | Status: DC | PRN
  Filled 2024-04-04: qty 1

## 2024-04-04 MED ORDER — SODIUM CHLORIDE 0.9 % IV SOLN
2.0000 g | Freq: Two times a day (BID) | INTRAVENOUS | Status: DC
  Administered 2024-04-04: 2 g via INTRAVENOUS
  Filled 2024-04-04: qty 12.5

## 2024-04-04 MED ORDER — POLYVINYL ALCOHOL 1.4 % OP SOLN: 1.0000 [drp] | Freq: Four times a day (QID) | OPHTHALMIC | Status: DC | PRN

## 2024-04-04 NOTE — Evaluation (Signed)
 Clinical/Bedside Swallow Evaluation Patient Details  Name: Eric Bennett MRN: [id number] Date of Birth: 1946-08-06  Today's Date: 04/04/2024 Time: SLP Start Time (ACUTE ONLY): 0800 SLP Stop Time (ACUTE ONLY): 0820 SLP Time Calculation (min) (ACUTE ONLY): 20 min  Past Medical History:  Past Medical History:  Diagnosis Date   Bilateral cataracts    Dementia (HCC)    Hypercholesteremia    Hypertension    IDA (iron deficiency anemia)    Memory loss 06/04/2020   Oropharyngeal dysphagia    Parkinson disease (HCC)    Primary open angle glaucoma (POAG) of both eyes    Past Surgical History:  Past Surgical History:  Procedure Laterality Date   BIOPSY  12/07/2020   Procedure: BIOPSY;  Surgeon: Eric Victory LITTIE DOUGLAS, MD;  Location: [facility];  Service: Gastroenterology;;   CATARACT EXTRACTION, BILATERAL     COLONOSCOPY WITH PROPOFOL  N/A 12/07/2020   Procedure: COLONOSCOPY WITH PROPOFOL ;  Surgeon: Eric Victory LITTIE DOUGLAS, MD;  Location: [facility];  Service: Gastroenterology;  Laterality: N/A;   ESOPHAGOGASTRODUODENOSCOPY (EGD) WITH PROPOFOL  N/A 12/07/2020   Procedure: ESOPHAGOGASTRODUODENOSCOPY (EGD) WITH PROPOFOL ;  Surgeon: Eric Victory LITTIE DOUGLAS, MD;  Location: [facility];  Service: Gastroenterology;  Laterality: N/A;   HEMOSTASIS CLIP PLACEMENT  12/07/2020   Procedure: HEMOSTASIS CLIP PLACEMENT;  Surgeon: Eric Victory LITTIE DOUGLAS, MD;  Location: [facility];  Service: Gastroenterology;;   POLYPECTOMY  12/07/2020   Procedure: POLYPECTOMY;  Surgeon: Eric Victory LITTIE DOUGLAS, MD;  Location: [facility];  Service: Gastroenterology;;   HPI:  Eric Bennett is a 78 y.o. male with medical history significant for hypertension, hyperlipidemia, Parkinson disease, and dementia who was sent from his nursing facility for lethargy and purulent urine.     The patient is unable to provide reliable history due to his dementia and acute illness.   Pt also has h/o oropharyngeal dysphagia.  CT abdomen showed Bilateral lower lobe  pneumonia and/or aspiration. MBS 2021 showed mild dysphagia - lingual pumping, retention and laryngeal penetration transient.    Assessment / Plan / Recommendation  Clinical Impression  Patient currently awake but is dysarthric and grossly weak - he also appears with slight right facial asymmetry.   Oral cavity full of secretions that are viscous.  SLP provided toothbrushing with oral suction.   Following oral care, pt demonstrates increased RR (to mid 40's) with increased congestion with decreased oxygen saturation (mid 80's) - RN made aware and she placed pt on facemask.    Cued cough did not clear secretions although pt made good effort - and appeared uncomfortable to him.  Did not provide po intake - due to pt's worsening resp status with oral care. Recommend NPO with frequent oral care/moisture.  Spoke to RN and Md regarding pt's current situation/status.  At this time, po intake would likely not be comfortable for this pt and he denies desire for po.  Left pt with Sherlean music playing per his request via 2 choices.    SLP Visit Diagnosis: Dysphagia, oropharyngeal phase (R13.12)    Aspiration Risk  Risk for inadequate nutrition/hydration;Severe aspiration risk    Diet Recommendation NPO (oral moisture)    Medication Administration: Via alternative means    Other  Recommendations Oral Care Recommendations: Oral care QID     Assistance Recommended at Discharge    Functional Status Assessment Patient has had a recent decline in their functional status and/or demonstrates limited ability to make significant improvements in function in a reasonable and predictable amount of time  Frequency  and Duration            Prognosis Prognosis for improved oropharyngeal function: Guarded Barriers to Reach Goals: Severity of deficits      Swallow Study   General Date of Onset: 04/04/24 HPI: [name] is a 78 y.o. male with medical history significant for hypertension, hyperlipidemia,  Parkinson disease, and dementia who was sent from his nursing facility for lethargy and purulent urine.     The patient is unable to provide reliable history due to his dementia and acute illness.   Pt also has h/o oropharyngeal dysphagia.  CT abdomen showed Bilateral lower lobe pneumonia and/or aspiration. MBS 2021 showed mild dysphagia - lingual pumping, retention and laryngeal penetration transient. Type of Study: Bedside Swallow Evaluation Diet Prior to this Study: NPO Temperature Spikes Noted: No Respiratory Status: Nasal cannula History of Recent Intubation: No Behavior/Cognition: Alert Oral Cavity Assessment: Excessive secretions Oral Care Completed by SLP: Other (Comment) (removed upper denture and cleaned) Oral Cavity - Dentition: Dentures, top Vision: Functional for self-feeding Self-Feeding Abilities: Able to feed self Patient Positioning: Upright in bed Baseline Vocal Quality: Suspected CN X (Vagus) involvement;Not observed Volitional Cough: Weak Volitional Swallow: Unable to elicit    Oral/Motor/Sensory Function Overall Oral Motor/Sensory Function: Generalized oral weakness   Ice Chips Ice chips: Not tested   Thin Liquid Other Comments: oral moisture with dental care resulted in pt having increased respiratory difficulties - and increased congestion - ? at larynx - upper trachea that did not clear    Nectar Thick Nectar Thick Liquid: Not tested   Honey Thick Honey Thick Liquid: Not tested   Puree Puree: Not tested   Solid     Solid: Not tested      Eric Bennett 04/04/2024,8:33 AM   Eric POUR, MS [name] SLP Acute Rehab Services Office [phone]

## 2024-04-04 NOTE — Plan of Care (Signed)
  Problem: Fluid Volume: Goal: Hemodynamic stability will improve Outcome: Progressing   Problem: Education: Goal: Knowledge of General Education information will improve Description: Including pain rating scale, medication(s)/side effects and non-pharmacologic comfort measures Outcome: Progressing   Problem: Health Behavior/Discharge Planning: Goal: Ability to manage health-related needs will improve Outcome: Progressing   

## 2024-04-04 NOTE — Consult Note (Signed)
 Consultation Note Date: 04/04/2024   Patient Name: Eric Bennett  DOB: May 31, 1946  MRN: [id number]  Age / Sex: 78 y.o., male   PCP: Cloria Annabella CROME, DO Referring Physician: Cherlyn Labella, MD  Reason for Consultation: Establishing goals of care     Chief Complaint/History of Present Illness:   Patient is a 78 year old male with a past medical history of hypertension, hyperlipidemia, Parkinson's disease, and dementia who was transferred from long-term care nursing facility on 04/03/2024 for management of lethargy and purulent urine.  Upon admission imaging concerning for bilateral lobe pneumonia with possible aspiration, severe cystitis, proctocolitis, and a left adrenal nodule.  Since admission patient has received management for severe sepsis secondary to bibasilar pneumonia, urinary tract infection, and colitis.  Patient evaluated by SLP and noted to be severe aspiration risk.  Palliative medicine team consulted to assist with complex medical decision making.  Extensive review of EMR prior to presenting to bedside.  Reviewed recent documentation including SLP and hospitalist.  Reviewed paper chart documentation from nursing facility noting that primary contact was patient's niece, Eric Bennett. Discussed care with hospitalist and bedside RN for medical updates.  Patient's respiratory status has continued to deteriorate requiring 20 L/min support on nonrebreather mask.  Patient's respiratory rate remaining elevated in the 50s.  ------------------------------------------------------------------------------------------------------------- Advance Care Planning Conversation  Pertinent diagnosis: Dementia, Parkinson's disease, acute respiratory failure in setting of bilateral pneumonia with severe aspiration risk, urinary tract infection, frailty  The patient and/or family consented to a voluntary Advance Care Planning Conversation in person/over the phone. Individuals present for the conversation:  Patient unable to participate in complex medical decision-making due to underlying medical conditions including dementia.  Discussed care with patient's niece/emergency contact/reported HCPOA, Eric Bennett, over the phone  Summary of the conversation:  Presented to bedside to see patient.  Patient noted to have great increase in work of breathing on nonrebreather.  No visitors present at bedside.  Patient minimally interactive.  Did let patient know would reach out to his niece, Eric, to discuss his care and patient agreed to this.  Able to call patient's niece, Eric Bennett.  Introduced myself as a member of the palliative medicine team my role in patient's medical journey.  Eric did state that she is patient's HCPOA and has been assisting in his care for over 8 years.  Eric noted she had received recent update from hospitalist.  Able to update that while at bedside with patient at this time, patient appears to be in distress with increased work of breathing.  Patient appears to be very uncomfortable.  Discussed possible pathways for medical care moving forward.  Noted patient continuing to deteriorate despite oxygen support.  Discussed possibility of transitioning to comfort focused care at this time.  Detailed what comfort care would and would not entail.  Eric Bennett agreeing with transition to full comfort focused care at this time to allow patient comfort and management of symptoms at the end of life.  Did express concern that based on patient's work of breathing, time could be short on matter of hours to days.  Eric acknowledged this.  Eric noted that unfortunately she is out of town though she has already informed to multiple family members about patient's worsening illness so they can visit.  Discussed that if family would like to visit, would recommend they come sooner rather than later with patient's worsening status.  Eric acknowledged this.  Noted priority at this time would be patient's comfort at  the end of life.  Spent time providing emotional support via active listening.  Answered all questions as able at that time.  Noted palliative medicine to continue following patient's medical journey.  Outcome of the conversations and/or documents completed:  Patient transition to full comfort focused care at this time.  Based on current examination, anticipate in-hospital death.  Should patient stabilize, could consider evaluation for inpatient hospice.  I spent 30 minutes providing separately identifiable ACP services with the patient and/or surrogate decision maker in a voluntary, in-person conversation discussing the patient's wishes and goals as detailed in the above note.  Tinnie Radar, DO Palliative Medicine Provider  -------------------------------------------------------------------------------------------------------------  Updated care team including hospitalist and RN regarding transition to comfort focused care at this time.  Primary Diagnoses  Present on Admission:  Septic shock (HCC)  Acute renal failure (ARF) (HCC)  Acute cystitis  Pneumonia  Colitis  Parkinson disease (HCC)  Left adrenal mass (HCC)   Palliative Review of Systems: Difficulty breathing  Past Medical History:  Diagnosis Date   Bilateral cataracts    Dementia (HCC)    Hypercholesteremia    Hypertension    IDA (iron deficiency anemia)    Memory loss 06/04/2020   Oropharyngeal dysphagia    Parkinson disease (HCC)    Primary open angle glaucoma (POAG) of both eyes    Social History   Socioeconomic History   Marital status: Single    Spouse name: Not on file   Number of children: 1   Years of education: 12   Highest education level: High school graduate  Occupational History   Occupation: Retired  Tobacco Use   Smoking status: Never   Smokeless tobacco: Never  Substance and Sexual Activity   Alcohol  use: Not Currently   Drug use: Never   Sexual activity: Not on file  Other Topics  Concern   Not on file  Social History Narrative   06/04/20 Lives alone. Caregivers daily   Right-handed.   One cup coffee per day.   Social Drivers of Corporate investment banker Strain: Not on file  Food Insecurity: Low Risk  (02/06/2024)   Received from [facility]   Hunger Vital Sign    Within the past 12 months, you worried that your food would run out before you got money to buy more: Never true    Within the past 12 months, the food you bought just didn't last and you didn't have money to get more. : Never true  Transportation Needs: No Transportation Needs (02/06/2024)   Received from [facility]    In the past 12 months, has lack of reliable transportation kept you from medical appointments, meetings, work or from getting things needed for daily living? : No  Physical Activity: Not on file  Stress: Not on file  Social Connections: Not on file   Family History  Problem Relation Age of Onset   Stroke Mother    Diabetes Mother    Other Father        brain tumor - not sure if is was cancerous   Scheduled Meds:  Chlorhexidine  Gluconate Cloth  6 each Topical Daily   heparin   5,000 Units Subcutaneous Q8H   sodium chloride  flush  3 mL Intravenous Q12H   Continuous Infusions:  sodium chloride  75 mL/hr at 04/04/24 0853   albumin  human 12.5 g (04/04/24 0854)   azithromycin  Stopped (04/03/24 2143)   ceFEPime  (MAXIPIME ) IV 2 g (04/04/24 0926)   PRN Meds:.acetaminophen  **OR** acetaminophen , ipratropium-albuterol , morphine  injection,  mouth rinse, prochlorperazine  No Known Allergies CBC:    Component Value Date/Time   WBC 12.8 (H) 04/04/2024 0416   HGB 9.7 (L) 04/04/2024 0416   HCT 30.8 (L) 04/04/2024 0416   PLT 153 04/04/2024 0416   MCV 85.1 04/04/2024 0416   NEUTROABS 14.4 (H) 04/03/2024 1539   LYMPHSABS 0.9 04/03/2024 1539   MONOABS 1.2 (H) 04/03/2024 1539   EOSABS 0.0 04/03/2024 1539   BASOSABS 0.0 04/03/2024 1539   Comprehensive Metabolic  Panel:    Component Value Date/Time   NA 142 04/04/2024 0416   K 3.8 04/04/2024 0416   CL 112 (H) 04/04/2024 0416   CO2 20 (L) 04/04/2024 0416   BUN 29 (H) 04/04/2024 0416   CREATININE 1.41 (H) 04/04/2024 0416   GLUCOSE 112 (H) 04/04/2024 0416   CALCIUM 8.9 04/04/2024 0416   AST 31 04/04/2024 0416   ALT 19 04/04/2024 0416   ALKPHOS 43 04/04/2024 0416   BILITOT 1.4 (H) 04/04/2024 0416   PROT 6.3 (L) 04/04/2024 0416   ALBUMIN  3.4 (L) 04/04/2024 0416    Physical Exam: Vital Signs: BP (!) 155/75   Pulse 99   Temp (!) 97.5 F (36.4 C) (Oral)   Resp (!) 47   Ht 5' 11 (1.803 m)   Wt 61.4 kg   SpO2 91%   BMI 18.88 kg/m  SpO2: SpO2: 91 % O2 Device: O2 Device: Nasal Cannula, NRB O2 Flow Rate: O2 Flow Rate (L/min): 15 L/min Intake/output summary:  Intake/Output Summary (Last 24 hours) at 04/04/2024 9047 Last data filed at 04/04/2024 9147 Gross per 24 hour  Intake 1999.87 ml  Output 850 ml  Net 1149.87 ml   LBM: Last BM Date : 04/03/24 Baseline Weight: Weight: 64 kg Most recent weight: Weight: 61.4 kg  General: Ill-appearing, cachectic, frail, lethargic Cardiovascular: RRR Respiratory: increased work of breathing noted on nonrebreather 20 L/min with nasal cannula Extremities: Muscle wasting in all extremities Neuro: Lethargic, minimally responsive         Palliative Performance Scale: 10%              Additional Data Reviewed: Recent Labs    04/03/24 1539 04/04/24 0416  WBC 16.6* 12.8*  HGB 12.5* 9.7*  PLT 216 153  NA 146* 142  BUN 32* 29*  CREATININE 3.11* 1.41*    Imaging: DG Chest Port 1 View CLINICAL DATA:  Respiratory complications.  EXAM: PORTABLE CHEST 1 VIEW  COMPARISON:  04/03/2024  FINDINGS: Heart size and mediastinal contours are unremarkable. Bibasilar opacities are again noted corresponding to areas of atelectasis and airspace disease noted on CT from the previous day. Upper lung zones appear clear. No significant pleural effusion or  signs of pneumothorax. Visualized bony structures are intact.  IMPRESSION: Bibasilar opacities corresponding to areas of atelectasis and airspace disease noted on CT from the previous day.  Electronically Signed   By: Waddell Calk M.D.   On: 04/04/2024 04:58    I personally reviewed recent imaging.   Palliative Care Assessment and Plan Summary of Established Goals of Care and Medical Treatment Preferences   Patient is a 78 year old male with a past medical history of hypertension, hyperlipidemia, Parkinson's disease, and dementia who was transferred from long-term care nursing facility on 04/03/2024 for management of lethargy and purulent urine.  Upon admission imaging concerning for bilateral lobe pneumonia with possible aspiration, severe cystitis, proctocolitis, and a left adrenal nodule.  Since admission patient has received management for severe sepsis secondary to bibasilar pneumonia, urinary tract infection,  and colitis.  Patient evaluated by SLP and noted to be severe aspiration risk.  Palliative medicine team consulted to assist with complex medical decision making.  # Complex medical decision making/goals of care  # Complex medical decision making/goals of care  -Patient unable to participate in medical decision making secondary to medical status.  -Spoke with patient's primary contact/reported HCPOA, niece Eric Bennett as detailed above in HPI.  Discussed patient's worsening medical status and that patient has underlying conditions that cannot be fixed such as aspiration in setting of dementia and Parkinson's.  Discussed pathways for medical care moving forward.  Niece agreeing with transitioning to full comfort focused care at this time acknowledging that patient is at end-of-life.  Eric to inform family to visit if able to say goodbye.  -At this time we will discontinue interventions that are no longer focused on comfort such as IV fluids, imaging, or lab work.  Will instead  focus on symptom management of pain, dyspnea, and agitation in the setting of end-of-life care.    Code Status: Do not attempt resuscitation (DNR) - Comfort care  # Symptom management Patient is receiving these palliative interventions for symptom management with an intent to improve quality of life.     -Pain/Dyspnea, acute in the setting of end-of-life care                Patient was not on medications for pain previously.                               -Start IV Dilaudid  continuous infusion with titratable doses and bolus via infusion every 15 minute as needed.  Continue to adjust based on patient's symptom burden.                  -Anxiety/agitation, in the setting of end-of-life care                               -Start IV Ativan  0.5 mg every 4 hours as needed. Continue to adjust based on patient's symptom burden.                                 -Start IV Haldol  1 mg every 4 hours as needed. Continue to adjust based on patient's symptom burden.                   -Secretions, in the setting of end-of-life care                               -Start IV glycopyrrolate  0.2 mg every 4 hours as needed.   # Psycho-social/Spiritual Support:  - Support System: Niece - Desire for further Chaplain support:yes  # Discharge Planning:  Anticipated Hospital Death based on current evaluation.  If patient stabilizes, could consider evaluation for inpatient hospice. Thank you for allowing the palliative care team to participate in the care [facility].  Tinnie Radar, DO Palliative Care Provider PMT # [phone]  If patient remains symptomatic despite maximum doses, please call PMT at [phone] between 0700 and 1900. Outside of these hours, please call attending, as PMT does not have night coverage.  Billing based on MDM: High  Problems Addressed: One acute or chronic illness or injury that poses  a threat to life or bodily function  Risks: Parenteral controlled substances and Decision  regarding hospitalization or escalation of hospital care

## 2024-04-04 NOTE — Progress Notes (Signed)
 [facility]                                                                               [place], is a 78 y.o. male, DOB - 1946-03-06, FMW:[id number] Admit date - 04/03/2024    Outpatient Primary MD for the patient is Reed, Tiffany L, DO  LOS - 1  days    Brief summary   Eric Bennett is a 78 y.o. male with medical history significant for hypertension, hyperlipidemia, Parkinson disease, and dementia who was sent from his nursing facility for lethargy and purulent urine.  CT is concerning for bilateral lower lobe pneumonia or aspiration, severe cystitis, proctocolitis, and left adrenal nodule.   Patient was started on broad spectrum IV antibiotics and admitted to step down.   Assessment & Plan    Assessment and Plan:   Severe sepsis secondary to bibasilar pneumonia, urinary tract infection, colitis He was started on broad-spectrum IV antibiotics, albumin .    Acute respiratory failure with hypoxia requiring NRB Secondary to bibasilar pneumonia. Tachypneic, tachycardic, encephalopathic on exam today   Acute metabolic encephalopathy from sepsis secondary to pneumonia and AKI  H/o Parkinson's disease/ Dementia    In view of multiple medical issues, critically ill, palliative care consulted , After discussions with his niece , he was transitioned to comfort measures.  Appreciate palliative medicine input.     Estimated body mass index is 18.88 kg/m as calculated from the following:   Height as of this encounter: 5' 11 (1.803 m).   Weight as of this encounter: 61.4 kg.  Code Status: DNR limited.  DVT Prophylaxis:  heparin  injection 5,000 Units Start: 04/03/24 2200   Level of Care: Level of care: Stepdown Family Communication: Updated patient's Niece over the phone.   Disposition Plan:     Remains inpatient appropriate:  pending clinical improvement.   Procedures:  None.   Consultants:   Palliative care  Antimicrobials:    Anti-infectives (From admission, onward)    Start     Dose/Rate Route Frequency Ordered Stop   04/05/24 2000  vancomycin  (VANCOREADY) IVPB 750 mg/150 mL  Status:  Discontinued        750 mg 150 mL/hr over 60 Minutes Intravenous Every 48 hours 04/03/24 1957 04/03/24 2305   04/04/24 1000  ceFEPIme  (MAXIPIME ) 2 g in sodium chloride  0.9 % 100 mL IVPB        2 g 200 mL/hr over 30 Minutes Intravenous Every 12 hours 04/04/24 0714     04/04/24 0000  ceFEPIme  (MAXIPIME ) 1 g in sodium chloride  0.9 % 100 mL IVPB  Status:  Discontinued        1 g 200 mL/hr over 30 Minutes Intravenous Every 24 hours 04/03/24 1957 04/04/24 0714   04/03/24 2000  azithromycin  (ZITHROMAX ) 500 mg in sodium chloride  0.9 % 250 mL IVPB        500 mg 250 mL/hr over 60 Minutes Intravenous Every 24 hours 04/03/24 1944 04/08/24 1959   04/03/24 1900  vancomycin  (VANCOCIN ) IVPB 1000 mg/200 mL premix        1,000 mg 200 mL/hr over 60 Minutes Intravenous  Once 04/03/24 1851 04/03/24 2003   04/03/24  1545  piperacillin -tazobactam (ZOSYN ) IVPB 3.375 g        3.375 g 100 mL/hr over 30 Minutes Intravenous  Once 04/03/24 1534 04/03/24 1612        Medications  Scheduled Meds:  Chlorhexidine  Gluconate Cloth  6 each Topical Daily   heparin   5,000 Units Subcutaneous Q8H   sodium chloride  flush  3 mL Intravenous Q12H   Continuous Infusions:  sodium chloride      albumin  human Stopped (04/04/24 0229)   azithromycin  Stopped (04/03/24 2143)   ceFEPime  (MAXIPIME ) IV     PRN Meds:.acetaminophen  **OR** acetaminophen , ipratropium-albuterol , mouth rinse, prochlorperazine     Subjective:   Eric Bennett was seen and examined today.  On NRB, some grunting on calling his name.  Objective:   Vitals:   04/04/24 0500 04/04/24 0600 04/04/24 0601 04/04/24 0700  BP: (!) 101/44 129/69  127/76  Pulse: 77 85 86 87  Resp: (!) 26 (!) 32 (!) 35 (!) 37  Temp:      TempSrc:      SpO2: 90% (!) 85% 93% 94%  Weight:      Height:         Intake/Output Summary (Last 24 hours) at 04/04/2024 0822 Last data filed at 04/04/2024 0400 Gross per 24 hour  Intake 1278.36 ml  Output 850 ml  Net 428.36 ml   Filed Weights   04/03/24 1517 04/04/24 0400  Weight: 64 kg 61.4 kg     Exam General exam: ill appearing elderly gentleman on NRB,.  Respiratory system: bilateral rhonchi, air entry fair. No wheezing heard  Cardiovascular system: S1 & S2 heard, RRR. No JVD Gastrointestinal system: Abdomen is soft bs+ Central nervous system:  minimally following commands, lethargic.  Extremities: no edema  Skin: No rashes, Psychiatry: unable to assess    Data Reviewed:  I have personally reviewed following labs and imaging studies   CBC Lab Results  Component Value Date   WBC 12.8 (H) 04/04/2024   RBC 3.62 (L) 04/04/2024   HGB 9.7 (L) 04/04/2024   HCT 30.8 (L) 04/04/2024   MCV 85.1 04/04/2024   MCH 26.8 04/04/2024   PLT 153 04/04/2024   MCHC 31.5 04/04/2024   RDW 16.0 (H) 04/04/2024   LYMPHSABS 0.9 04/03/2024   MONOABS 1.2 (H) 04/03/2024   EOSABS 0.0 04/03/2024   BASOSABS 0.0 04/03/2024     Last metabolic panel Lab Results  Component Value Date   NA 142 04/04/2024   K 3.8 04/04/2024   CL 112 (H) 04/04/2024   CO2 20 (L) 04/04/2024   BUN 29 (H) 04/04/2024   CREATININE 1.41 (H) 04/04/2024   GLUCOSE 112 (H) 04/04/2024   GFRNONAA 51 (L) 04/04/2024   CALCIUM 8.9 04/04/2024   PHOS 2.9 04/04/2024   PROT 6.3 (L) 04/04/2024   ALBUMIN  3.4 (L) 04/04/2024   BILITOT 1.4 (H) 04/04/2024   ALKPHOS 43 04/04/2024   AST 31 04/04/2024   ALT 19 04/04/2024   ANIONGAP 10 04/04/2024    CBG (last 3)  No results for input(s): GLUCAP in the last 72 hours.    Coagulation Profile: Recent Labs  Lab 04/03/24 1539  INR 1.2     Radiology Studies: [facility] Chest Port 1 View Result Date: 04/04/2024 CLINICAL DATA:  Respiratory complications. EXAM: PORTABLE CHEST 1 VIEW COMPARISON:  04/03/2024 FINDINGS: Heart size and mediastinal  contours are unremarkable. Bibasilar opacities are again noted corresponding to areas of atelectasis and airspace disease noted on CT from the previous day. Upper lung zones appear clear.  No significant pleural effusion or signs of pneumothorax. Visualized bony structures are intact. IMPRESSION: Bibasilar opacities corresponding to areas of atelectasis and airspace disease noted on CT from the previous day. Electronically Signed   By: Waddell Calk M.D.   On: 04/04/2024 04:58   CT ABDOMEN PELVIS WO CONTRAST Result Date: 04/03/2024 EXAM: CT ABDOMEN AND PELVIS WITHOUT CONTRAST 04/03/2024 06:21:00 PM TECHNIQUE: CT of the abdomen and pelvis was performed without the administration of intravenous contrast. Multiplanar reformatted images are provided for review. Automated exposure control, iterative reconstruction, and/or weight based adjustment of the mA/kV was utilized to reduce the radiation dose to as low as reasonably achievable. COMPARISON: None available. CLINICAL HISTORY: Sepsis. Increased weakness and malaise for 4 days. Staff said urine was chunky. Has a foley catheter and dementia. FINDINGS: LOWER CHEST: Patchy airspace opacities in the bilateral lower lobes compatible with aspiration and/or pneumonia. LIVER: The liver is unremarkable. GALLBLADDER AND BILE DUCTS: Gallbladder is unremarkable. No biliary ductal dilatation. SPLEEN: No acute abnormality. PANCREAS: No acute abnormality. ADRENAL GLANDS: Intermediate density (Hounsfield unit 40) nodule in the left adrenal gland measuring 2.1 cm. KIDNEYS, URETERS AND BLADDER: Nonobstructing left nephrolithiasis. No hydronephrosis. Foley catheter in the markedly thick walled bladder. Gas is also present within the bladder. Marked perivesical fat stranding and fluid. GI AND BOWEL: Stomach demonstrates no acute abnormality. There is no bowel obstruction. Wall thickening of the distal sigmoid colon and rectum with adjacent stranding and fluid. PERITONEUM AND  RETROPERITONEUM: No free intraperitoneal air. Free fluid and stranding in the pelvis. VASCULATURE: Aortic atherosclerotic calcification. No aortic aneurysm. LYMPH NODES: No lymphadenopathy. REPRODUCTIVE ORGANS: No acute abnormality. BONES AND SOFT TISSUES: No acute fracture. No focal soft tissue abnormality. IMPRESSION: 1. Bilateral lower lobe pneumonia and/or aspiration. 2. Severe cystitis. 3. Proctocolitis of the distal sigmoid colon and rectum. 4. Enlarged prostate. 5. Intermediate density (Hounsfield unit 40) nodule in the left adrenal gland measuring 2.1 cm. Consider nonemergent adrenal protocol CT for further evaluation. Electronically signed by: Norman Gatlin MD 04/03/2024 06:37 PM EDT RP Workstation: [id number]   DG Chest Portable 1 View Result Date: 04/03/2024 CLINICAL DATA:  Sepsis EXAM: PORTABLE CHEST 1 VIEW COMPARISON:  Chest x-ray 08/19/2021 FINDINGS: The heart is enlarged. The lungs are clear. The aorta is ectatic. There is no pleural effusion or pneumothorax. No acute fractures are seen. IMPRESSION: Cardiomegaly. No acute cardiopulmonary process. Electronically Signed   By: Greig Pique M.D.   On: 04/03/2024 16:46       Elgie Butter M.D. [facility] 04/04/2024, 8:22 AM  Available via Epic secure chat 7am-7pm After 7 pm, please refer to night coverage provider listed on amion.

## 2024-04-04 NOTE — Plan of Care (Signed)
  Daily Progress Note   Patient Name: Eric Bennett       Date: 04/04/2024 DOB: 1946-03-01  Age: 78 y.o. MRN#: [id number] Attending Physician: Cherlyn Labella, MD Primary Care Physician: Cloria Annabella CROME, DO Admit Date: 04/03/2024 Length of Stay: 1 day  Will place full consult note as soon as able. Discussed care with patient's reported HCPOA, Vina Lesches. Vina Lesches listed in patient's nursing home documents as primary contact and Vina stated she is patient's HCPOA and has helped take care of him for at least 8 years. Discussed patient's worsening respiratory status and work of breathing. At this time, recommended comfort focused care. Vina agreed with transition to comfort focused care and she did not want patient to suffer at the end of life. Discussed with his work of breathing, time could be very short. She acknowledged this and noted family has been informed by her to visit as soon as able. Vina noted she is out of town though can be called with any updates. Updated care team regarding transition to comfort focused care at this time.   Tinnie Radar, DO Palliative Care Provider PMT # [phone]  No Charge Note

## 2024-04-04 NOTE — Progress Notes (Signed)
   04/04/24 1130  Spiritual Encounters  Type of Visit Initial  Care provided to: Patient;Friend  Conversation partners present during encounter Nurse  Referral source Physician  Reason for visit End-of-life  OnCall Visit No  Interventions  Spiritual Care Interventions Made Established relationship of care and support;Compassionate presence;Prayer    Chaplain responded to spiritual consult/page. Alyse Ip bedside. I prayed over [place] per his request, after which we continued playing Gospel music in his room. Chaplains remain available.

## 2024-04-04 NOTE — Progress Notes (Signed)
 PHARMACY NOTE:  ANTIMICROBIAL RENAL DOSAGE ADJUSTMENT  Current antimicrobial regimen includes a mismatch between antimicrobial dosage and estimated renal function.  As per policy approved by the Pharmacy & Therapeutics and Medical Executive Committees, the antimicrobial dosage will be adjusted accordingly.  Current antimicrobial dosage:  cefepime  1g q24  Indication: PNA + UTI  Renal Function:  Estimated Creatinine Clearance: 37.5 mL/min (A) (by C-G formula based on SCr of 1.41 mg/dL (H)). []      On intermittent HD, scheduled: []      On CRRT    Antimicrobial dosage has been changed to:  Cefepime  2g q12  Additional comments:   Thank you for allowing pharmacy to be a part of this patient's care.  [name], [facility] 04/04/2024 7:13 AM

## 2024-04-05 DIAGNOSIS — F0390 Unspecified dementia without behavioral disturbance: Secondary | ICD-10-CM

## 2024-04-05 DIAGNOSIS — Z79899 Other long term (current) drug therapy: Secondary | ICD-10-CM | POA: Diagnosis not present

## 2024-04-05 DIAGNOSIS — K529 Noninfective gastroenteritis and colitis, unspecified: Secondary | ICD-10-CM | POA: Diagnosis not present

## 2024-04-05 DIAGNOSIS — A419 Sepsis, unspecified organism: Secondary | ICD-10-CM | POA: Diagnosis not present

## 2024-04-05 DIAGNOSIS — Z515 Encounter for palliative care: Secondary | ICD-10-CM | POA: Diagnosis not present

## 2024-04-05 DIAGNOSIS — N179 Acute kidney failure, unspecified: Secondary | ICD-10-CM | POA: Diagnosis not present

## 2024-04-05 LAB — URINE CULTURE: Culture: >=100000 — AB

## 2024-04-05 MED ORDER — HALOPERIDOL LACTATE 5 MG/ML IJ SOLN
2.0000 mg | INTRAMUSCULAR | Status: DC | PRN
  Administered 2024-04-05: 2 mg via INTRAVENOUS

## 2024-04-05 MED ORDER — HALOPERIDOL LACTATE 5 MG/ML IJ SOLN: 2.0000 mg | INTRAMUSCULAR | Status: DC | PRN

## 2024-04-05 MED ORDER — MIDAZOLAM BOLUS VIA INFUSION
1.0000 mg | INTRAVENOUS | Status: DC | PRN
  Administered 2024-04-05 (×6): 1 mg via INTRAVENOUS

## 2024-04-05 MED ORDER — MIDAZOLAM-SODIUM CHLORIDE 100-0.9 MG/100ML-% IV SOLN
0.5000 mg/h | INTRAVENOUS | Status: DC
  Administered 2024-04-05: 0.5 mg/h via INTRAVENOUS
  Administered 2024-04-06: 7.5 mg/h via INTRAVENOUS
  Administered 2024-04-06: 6 mg/h via INTRAVENOUS
  Administered 2024-04-06: 7.5 mg/h via INTRAVENOUS
  Administered 2024-04-06: 7 mg/h via INTRAVENOUS
  Filled 2024-04-05 (×3): qty 100

## 2024-04-05 NOTE — Plan of Care (Signed)
  Problem: Clinical Measurements: Goal: Diagnostic test results will improve Outcome: Not Progressing Goal: Signs and symptoms of infection will decrease Outcome: Not Progressing   Problem: Respiratory: Goal: Ability to maintain adequate ventilation will improve Outcome: Not Progressing   Problem: Clinical Measurements: Goal: Respiratory complications will improve Outcome: Not Progressing Goal: Cardiovascular complication will be avoided Outcome: Not Progressing   Problem: Pain Managment: Goal: General experience of comfort will improve and/or be controlled Outcome: Not Progressing   Problem: Clinical Measurements: Goal: Quality of life will improve Outcome: Not Progressing   Problem: Respiratory: Goal: Verbalizations of increased ease of respirations will increase Outcome: Not Progressing   Problem: Role Relationship: Goal: Family's ability to cope with current situation will improve Outcome: Not Progressing

## 2024-04-05 NOTE — Plan of Care (Signed)

## 2024-04-05 NOTE — TOC Initial Note (Signed)
 Transition of Care [facility]) - Initial/Assessment Note    Patient Details  Name: Eric Bennett MRN: [id number] Date of Birth: 04-22-1946  [facility] CM/SW Contact:    Jon ONEIDA Anon, RN Phone Number: 04/05/2024, 10:17 AM  Clinical Narrative:                 Pt is from [facility] as a Long term care resident. Pt niece Vina Lesches is his HCPOA. Palliative care been consulted and following. Pt transitioning to comfort care. TOC will follow for any new consults or recommendations.    Expected Discharge Plan: Long Term Nursing Home Barriers to Discharge: Continued Medical Work up   Patient Goals and CMS Choice Patient states their goals for this hospitalization and ongoing recovery are:: Home CMS Medicare.gov Compare Post Acute Care list provided to:: Other (Comment Required) (NA) Choice offered to / list presented to : NA [place] ownership interest in [facility].provided to:: Parent NA    Expected Discharge Plan and Services In-house Referral: Hospice / Palliative Care Discharge Planning Services: CM Consult Post Acute Care Choice: Nursing Home Living arrangements for the past 2 months: Skilled Nursing Facility                 DME Arranged: N/A DME Agency: NA       HH Arranged: NA HH Agency: NA        Prior Living Arrangements/Services Living arrangements for the past 2 months: Skilled Nursing Facility Lives with:: Facility Resident Patient language and need for interpreter reviewed:: Yes Do you feel safe going back to the place where you live?: Yes      Need for Family Participation in Patient Care: Yes (Comment) Care giver support system in place?: Yes (comment) Current home services: Other (comment) (NA) Criminal Activity/Legal Involvement Pertinent to Current Situation/Hospitalization: No - Comment as needed  Activities of Daily Living   ADL Screening (condition at time of admission) Independently performs ADLs?: No Does the patient  have a NEW difficulty with bathing/dressing/toileting/self-feeding that is expected to last >3 days?: No Does the patient have a NEW difficulty with getting in/out of bed, walking, or climbing stairs that is expected to last >3 days?: No Does the patient have a NEW difficulty with communication that is expected to last >3 days?: Yes (Initiates electronic notice to provider for possible SLP consult) Is the patient deaf or have difficulty hearing?: No Does the patient have difficulty seeing, even when wearing glasses/contacts?: No Does the patient have difficulty concentrating, remembering, or making decisions?: Yes  Permission Sought/Granted Permission sought to share information with : Facility Medical sales representative Permission granted to share information with : Yes, Verbal Permission Granted  Share Information with NAME: Lesches Vina (Niece)  [phone]  Permission granted to share info w AGENCY: [facility]        Emotional Assessment Appearance:: Other (Comment Required (UTA) Attitude/Demeanor/Rapport: Unable to Assess Affect (typically observed): Unable to Assess Orientation: :  (Pt is disoriented x4) Alcohol  / Substance Use: Not Applicable Psych Involvement: No (comment)  Admission diagnosis:  Septic shock (HCC) [A41.9, R65.21] Sepsis with acute renal failure and septic shock, due to unspecified organism, unspecified acute renal failure type (HCC) [A41.9, R65.21, N17.9] Patient Active Problem List   Diagnosis Date Noted   Palliative care encounter 04/04/2024   High risk medication use 04/04/2024   Medication management 04/04/2024   Counseling and coordination of care 04/04/2024   Dementia (HCC) 04/04/2024   Concern about end of life  04/04/2024   DNR (do not resuscitate) 04/04/2024   Aspiration into airway 04/04/2024   ACP (advance care planning) 04/04/2024   Sepsis with acute renal failure and septic shock (HCC) 04/04/2024   Septic shock (HCC) 04/03/2024   Acute renal  failure (ARF) (HCC) 04/03/2024   Acute cystitis 04/03/2024   Pneumonia 04/03/2024   Colitis 04/03/2024   Left adrenal mass (HCC) 04/03/2024   Cervical stenosis of spinal canal 09/02/2020   Memory loss 06/04/2020   Parkinson disease (HCC) 10/17/2019   Gait abnormality 10/17/2019   PCP:  Cloria Annabella CROME, DO Pharmacy:   [facility] - [place], [place] - [address] [place] [address] Phone: [phone] Fax: [phone]     Social Drivers of Health (SDOH) Social History: SDOH Screenings   Food Insecurity: Low Risk  (02/06/2024)   Received from [facility]  Housing: Low Risk  (02/06/2024)   Received from [facility]  Transportation Needs: No Transportation Needs (02/06/2024)   Received from [facility]  Utilities: Low Risk  (02/06/2024)   Received from [facility]  Tobacco Use: Low Risk  (04/03/2024)   SDOH Interventions:     Readmission Risk Interventions    04/05/2024   10:07 AM  Readmission Risk Prevention Plan  Transportation Screening Complete  PCP or Specialist Appt within 5-7 Days Complete  Home Care Screening Complete  Medication Review (RN CM) Complete

## 2024-04-05 NOTE — Progress Notes (Signed)
 [facility]                                                                               Eric Bennett, is a 78 y.o. male, DOB - 1946-06-02, FMW:[id number] Admit date - 04/03/2024    Outpatient Primary MD for the patient is Eric, Tiffany L, DO  LOS - 2  days    Brief summary   Eric Bennett is a 78 y.o. male with medical history significant for hypertension, hyperlipidemia, Parkinson disease, and dementia who was sent from his nursing facility for lethargy and purulent urine.  CT is concerning for bilateral lower lobe pneumonia or aspiration, severe cystitis, proctocolitis, and left adrenal nodule.   Patient was started on broad spectrum IV antibiotics and admitted to step down.   Assessment & Plan    Assessment and Plan:   Severe sepsis secondary to bibasilar pneumonia, urinary tract infection, colitis He was  initially started on broad-spectrum IV antibiotics, albumin . But he continued to decline.  After discussions with family, patient was transitioned to comfort measures.     Acute respiratory failure with hypoxia requiring NRB initially.  Secondary to bibasilar pneumonia.    Acute metabolic encephalopathy from sepsis secondary to pneumonia and AKI In the setting of Parkinson's disease/ Dementia    In view of multiple medical issues, critically ill, palliative care consulted , After discussions with his niece , he was transitioned to comfort measures.  Appreciate palliative medicine input.     Estimated body mass index is 18.88 kg/m as calculated from the following:   Height as of this encounter: 5' 11 (1.803 m).   Weight as of this encounter: 61.4 kg.  Code Status: DNR comfort  DVT Prophylaxis:  comfort care.    Level of Care: Level of care: Palliative Care Family Communication: Updated patient's Niece over the phone.   Disposition Plan:     Remains inpatient appropriate:  pending clinical improvement.   Procedures:  None.    Consultants:   Palliative care  Antimicrobials:   Anti-infectives (From admission, onward)    Start     Dose/Rate Route Frequency Ordered Stop   04/05/24 2000  vancomycin  (VANCOREADY) IVPB 750 mg/150 mL  Status:  Discontinued        750 mg 150 mL/hr over 60 Minutes Intravenous Every 48 hours 04/03/24 1957 04/03/24 2305   04/04/24 1000  ceFEPIme  (MAXIPIME ) 2 g in sodium chloride  0.9 % 100 mL IVPB  Status:  Discontinued        2 g 200 mL/hr over 30 Minutes Intravenous Every 12 hours 04/04/24 0714 04/04/24 1027   04/04/24 0000  ceFEPIme  (MAXIPIME ) 1 g in sodium chloride  0.9 % 100 mL IVPB  Status:  Discontinued        1 g 200 mL/hr over 30 Minutes Intravenous Every 24 hours 04/03/24 1957 04/04/24 0714   04/03/24 2000  azithromycin  (ZITHROMAX ) 500 mg in sodium chloride  0.9 % 250 mL IVPB  Status:  Discontinued        500 mg 250 mL/hr over 60 Minutes Intravenous Every 24 hours 04/03/24 1944 04/04/24 1027   04/03/24 1900  vancomycin  (VANCOCIN ) IVPB 1000 mg/200 mL premix  1,000 mg 200 mL/hr over 60 Minutes Intravenous  Once 04/03/24 1851 04/03/24 2003   04/03/24 1545  piperacillin -tazobactam (ZOSYN ) IVPB 3.375 g        3.375 g 100 mL/hr over 30 Minutes Intravenous  Once 04/03/24 1534 04/03/24 1612        Medications  Scheduled Meds:  sodium chloride  flush  3 mL Intravenous Q12H   Continuous Infusions:  HYDROmorphone  5.5 mg/hr (04/05/24 1036)   midazolam 0.5 mg/hr (04/05/24 1114)   PRN Meds:.acetaminophen  **OR** acetaminophen , antiseptic oral rinse, artificial tears, glycopyrrolate , haloperidol  lactate, HYDROmorphone , HYDROmorphone  (DILAUDID ) injection, LORazepam , midazolam, mouth rinse, prochlorperazine     Subjective:   Issiac Jamar was seen and examined today.  Comfortable.   Objective:   Vitals:   04/05/24 0700 04/05/24 0822 04/05/24 1016 04/05/24 1100  BP:      Pulse: (!) 164  (!) 169 (!) 191  Resp: 16  17 15   Temp:  98.4 F (36.9 C)    TempSrc:  Axillary     SpO2: (!) 69%  (!) 65% (!) 65%  Weight:      Height:        Intake/Output Summary (Last 24 hours) at 04/05/2024 1140 Last data filed at 04/05/2024 1037 Gross per 24 hour  Intake 619.81 ml  Output 850 ml  Net -230.19 ml   Filed Weights   04/03/24 1517 04/04/24 0400  Weight: 64 kg 61.4 kg     Exam General exam: ill appearing elderly gentleman, not in distress.  Respiratory system: diminished air entry throughout.  Cardiovascular system: S1 & S2 heard,tachycardic.  Gastrointestinal system: Abdomen is soft Central nervous system: unresponsive, not following commands.  Extremities: no edema.  Skin: No rashes,   Data Reviewed:  I have personally reviewed following labs and imaging studies   CBC Lab Results  Component Value Date   WBC 12.8 (H) 04/04/2024   RBC 3.62 (Bennett) 04/04/2024   HGB 9.7 (Bennett) 04/04/2024   HCT 30.8 (Bennett) 04/04/2024   MCV 85.1 04/04/2024   MCH 26.8 04/04/2024   PLT 153 04/04/2024   MCHC 31.5 04/04/2024   RDW 16.0 (H) 04/04/2024   LYMPHSABS 0.9 04/03/2024   MONOABS 1.2 (H) 04/03/2024   EOSABS 0.0 04/03/2024   BASOSABS 0.0 04/03/2024     Last metabolic panel Lab Results  Component Value Date   NA 142 04/04/2024   K 3.8 04/04/2024   CL 112 (H) 04/04/2024   CO2 20 (Bennett) 04/04/2024   BUN 29 (H) 04/04/2024   CREATININE 1.41 (H) 04/04/2024   GLUCOSE 112 (H) 04/04/2024   GFRNONAA 51 (Bennett) 04/04/2024   CALCIUM 8.9 04/04/2024   PHOS 2.9 04/04/2024   PROT 6.3 (Bennett) 04/04/2024   ALBUMIN  3.4 (Bennett) 04/04/2024   BILITOT 1.4 (H) 04/04/2024   ALKPHOS 43 04/04/2024   AST 31 04/04/2024   ALT 19 04/04/2024   ANIONGAP 10 04/04/2024    CBG (last 3)  No results for input(s): GLUCAP in the last 72 hours.    Coagulation Profile: Recent Labs  Lab 04/03/24 1539  INR 1.2     Radiology Studies: [facility] Chest Port 1 View Result Date: 04/04/2024 CLINICAL DATA:  Respiratory complications. EXAM: PORTABLE CHEST 1 VIEW COMPARISON:  04/03/2024 FINDINGS: Heart size and  mediastinal contours are unremarkable. Bibasilar opacities are again noted corresponding to areas of atelectasis and airspace disease noted on CT from the previous day. Upper lung zones appear clear. No significant pleural effusion or signs of pneumothorax. Visualized bony structures are intact. IMPRESSION: Bibasilar  opacities corresponding to areas of atelectasis and airspace disease noted on CT from the previous day. Electronically Signed   By: Waddell Calk M.D.   On: 04/04/2024 04:58   CT ABDOMEN PELVIS WO CONTRAST Result Date: 04/03/2024 EXAM: CT ABDOMEN AND PELVIS WITHOUT CONTRAST 04/03/2024 06:21:00 PM TECHNIQUE: CT of the abdomen and pelvis was performed without the administration of intravenous contrast. Multiplanar reformatted images are provided for review. Automated exposure control, iterative reconstruction, and/or weight based adjustment of the mA/kV was utilized to reduce the radiation dose to as low as reasonably achievable. COMPARISON: None available. CLINICAL HISTORY: Sepsis. Increased weakness and malaise for 4 days. Staff said urine was chunky. Has a foley catheter and dementia. FINDINGS: LOWER CHEST: Patchy airspace opacities in the bilateral lower lobes compatible with aspiration and/or pneumonia. LIVER: The liver is unremarkable. GALLBLADDER AND BILE DUCTS: Gallbladder is unremarkable. No biliary ductal dilatation. SPLEEN: No acute abnormality. PANCREAS: No acute abnormality. ADRENAL GLANDS: Intermediate density (Hounsfield unit 40) nodule in the left adrenal gland measuring 2.1 cm. KIDNEYS, URETERS AND BLADDER: Nonobstructing left nephrolithiasis. No hydronephrosis. Foley catheter in the markedly thick walled bladder. Gas is also present within the bladder. Marked perivesical fat stranding and fluid. GI AND BOWEL: Stomach demonstrates no acute abnormality. There is no bowel obstruction. Wall thickening of the distal sigmoid colon and rectum with adjacent stranding and fluid. PERITONEUM  AND RETROPERITONEUM: No free intraperitoneal air. Free fluid and stranding in the pelvis. VASCULATURE: Aortic atherosclerotic calcification. No aortic aneurysm. LYMPH NODES: No lymphadenopathy. REPRODUCTIVE ORGANS: No acute abnormality. BONES AND SOFT TISSUES: No acute fracture. No focal soft tissue abnormality. IMPRESSION: 1. Bilateral lower lobe pneumonia and/or aspiration. 2. Severe cystitis. 3. Proctocolitis of the distal sigmoid colon and rectum. 4. Enlarged prostate. 5. Intermediate density (Hounsfield unit 40) nodule in the left adrenal gland measuring 2.1 cm. Consider nonemergent adrenal protocol CT for further evaluation. Electronically signed by: Norman Gatlin MD 04/03/2024 06:37 PM EDT RP Workstation: [id number]   DG Chest Portable 1 View Result Date: 04/03/2024 CLINICAL DATA:  Sepsis EXAM: PORTABLE CHEST 1 VIEW COMPARISON:  Chest x-ray 08/19/2021 FINDINGS: The heart is enlarged. The lungs are clear. The aorta is ectatic. There is no pleural effusion or pneumothorax. No acute fractures are seen. IMPRESSION: Cardiomegaly. No acute cardiopulmonary process. Electronically Signed   By: Greig Pique M.D.   On: 04/03/2024 16:46       Elgie Butter M.D. [facility] 04/05/2024, 11:40 AM  Available via Epic secure chat 7am-7pm After 7 pm, please refer to night coverage provider listed on amion.

## 2024-04-05 NOTE — Progress Notes (Signed)
 Daily Progress Note   Patient Name: Eric Bennett       Date: 04/05/2024 DOB: 05/15/1946  Age: 78 y.o. MRN#: [id number] Attending Physician: Cherlyn Labella, MD Primary Care Physician: Cloria Annabella CROME, DO Admit Date: 04/03/2024 Length of Stay: 2 days  Reason for Consultation/Follow-up: Establishing goals of care  Subjective:   CC: Patient unresponsive when seen, some tremors noted.  Following up regarding complex medical decision making.  Subjective:  Reviewed EMR prior to presenting to bedside including recent documentation from spiritual care and hospitalist.  Patient was transition to full comfort focused care on 04/04/2024.  At time of EMR review patient receiving IV Dilaudid  continuous infusion at 4.5 mg/h.  Patient has required IV Dilaudid  as needed 2 mg bolus x 6 doses in the past 24 hours.  Review of vitals noting patient still having tachycardia.  SPO2 decreasing. Discussed care with bedside RN for medical updates.    Presented to bedside to see patient.  Patient laying unresponsive in bed.  Patient caregiver present at bedside.  Able to introduce myself as a member of the palliative medicine team.  She noted that she has been updating patient's family including niece.  Again, respiratory discomfort at this time.  Upon examination of patient, remains tachycardic.  Discussed adjusting medications for this.  Also noted patient had audible secretions.  Noted would ask RN to provide medications for this as well.  Also noted patient to have some twitching and tremors.  Discussed will adjust medications accordingly to manage this.  Spent time providing emotional support via active listening.  All questions answered at that time.  Noted palliative medicine team will continue to follow with patient's medical journey.  Discussed care with team to coordinate needs.  Attempted use Haldol  to assist with tremors and agitation.  This did not lead to improvements.  Noted there is an Ativan  shortage so we  will be using Versed instead to assist with tremors and agitation.  Objective:   Vital Signs:  BP 110/61 (BP Location: Right Arm)   Pulse (!) 164   Temp 98.5 F (36.9 C) (Axillary)   Resp 16   Ht 5' 11 (1.803 m)   Wt 61.4 kg   SpO2 (!) 69%   BMI 18.88 kg/m   Physical Exam: General: Ill-appearing, cachectic, frail, unresponsive Cardiovascular: Tachycardia noted Respiratory: Slightly increased work of breathing Extremities: Muscle wasting in all extremities Neuro: Unresponsive  Assessment & Plan:   Assessment: Patient is a 78 year old male with a past medical history of hypertension, hyperlipidemia, Parkinson's disease, and dementia who was transferred from long-term care nursing facility on 04/03/2024 for management of lethargy and purulent urine.  Upon admission imaging concerning for bilateral lobe pneumonia with possible aspiration, severe cystitis, proctocolitis, and a left adrenal nodule.  Since admission patient has received management for severe sepsis secondary to bibasilar pneumonia, urinary tract infection, and colitis.  Patient evaluated by SLP and noted to be severe aspiration risk.  Palliative medicine team consulted to assist with complex medical decision making.   Recommendations/Plan: # Complex medical decision making/goals of care:  -Patient unable to participate in medical decision making secondary to medical status.                -Spoke with patient's primary contact/reported HCPOA, niece Eric Bennett on 04/04/2024 and patient was transition to full comfort focused care at that time.  Priority is patient's comfort at the end of life.  In-hospital death anticipated.                -  Have discontinued interventions that are no longer focused on comfort such as IV fluids, imaging, or lab work.  Focusing on symptom management of pain, dyspnea, and agitation in the setting of end-of-life care.                  Code Status: Do not attempt resuscitation (DNR) - Comfort  care   # Symptom management Patient is receiving these palliative interventions for symptom management with an intent to improve quality of life.                   -Pain/Dyspnea, acute in the setting of end-of-life care                Patient was not on medications for pain previously.                               - Continue IV Dilaudid  continuous infusion with titratable doses and bolus via infusion every 15 minute as needed.  Continue to adjust based on patient's symptom burden.                  -Anxiety/agitation, in the setting of end-of-life care                               -Start IV Versed titratable infusion with as needed boluses.  There is an Ativan  IV shortage so using this instead after patient did not respond to Haldol .                               - Change IV Haldol  2 mg every 4 hours as needed. Continue to adjust based on patient's symptom burden.                   -Secretions, in the setting of end-of-life care                               - Continue IV glycopyrrolate  0.2 mg every 4 hours as needed.   # Psycho-social/Spiritual Support:  - Support System: Niece - Desire for further Chaplain support:yes   # Discharge Planning:  Anticipated Hospital Death based on current evaluation.  If patient stabilizes, could consider evaluation for inpatient hospice.  Thank you for allowing the palliative care team to participate in the care [facility].  If patient remains symptomatic despite maximum doses, please call PMT at [phone] between 0700 and 1900. Outside of these hours, please call attending, as PMT does not have night coverage.  Tinnie Radar, DO Palliative Care Provider PMT # [phone]  Billing based on MDM: High  Problems Addressed: One acute or chronic illness or injury that poses a threat to life or bodily function  Risks: Parenteral controlled substances

## 2024-04-05 NOTE — Progress Notes (Signed)
 Versed was sent back to pharmacy as it cannot be kept in pyxis.

## 2024-04-06 DIAGNOSIS — R6521 Severe sepsis with septic shock: Secondary | ICD-10-CM | POA: Diagnosis not present

## 2024-04-06 DIAGNOSIS — N3000 Acute cystitis without hematuria: Secondary | ICD-10-CM | POA: Diagnosis not present

## 2024-04-06 DIAGNOSIS — J189 Pneumonia, unspecified organism: Secondary | ICD-10-CM | POA: Diagnosis not present

## 2024-04-06 DIAGNOSIS — G20A1 Parkinson's disease without dyskinesia, without mention of fluctuations: Secondary | ICD-10-CM | POA: Diagnosis not present

## 2024-04-06 DIAGNOSIS — A419 Sepsis, unspecified organism: Secondary | ICD-10-CM | POA: Diagnosis not present

## 2024-04-06 DIAGNOSIS — Z515 Encounter for palliative care: Secondary | ICD-10-CM | POA: Diagnosis not present

## 2024-04-06 LAB — CULTURE, BLOOD (ROUTINE X 2): Special Requests: ADEQUATE

## 2024-04-06 MED ORDER — GLYCOPYRROLATE 0.2 MG/ML IJ SOLN
0.2000 mg | INTRAMUSCULAR | Status: DC
  Administered 2024-04-06: 0.2 mg via INTRAVENOUS
  Filled 2024-04-06: qty 1

## 2024-04-08 LAB — CULTURE, BLOOD (ROUTINE X 2): Culture: NO GROWTH

## 2024-04-19 NOTE — Hospital Course (Signed)
 Eric Bennett is a 78 y.o. male with medical history significant for hypertension, hyperlipidemia, Parkinson disease, and dementia who was sent from his nursing facility for lethargy and purulent urine.  CT is concerning for bilateral lower lobe pneumonia or aspiration, severe cystitis, proctocolitis, and left adrenal nodule.    Patient was started on broad spectrum IV antibiotics and admitted to step down.  He also had severe sepsis secondary to bibasilar pneumonia, urinary tract infection, colitis.  Initially started on broad-spectrum IV antibiotics, albumin .  But he continued to decline.  After discussion with the family, patient was transitioned to comfort measures only.  Patient is currently getting medication to make him only comfort.

## 2024-04-19 NOTE — Progress Notes (Signed)
 RN confirmed pt death at 5:00 PM. Second nurse verified death. Dr. Roann notified via page at 5:09 PM.   This RN attempted to call niece POA (3 times) and nephew (twice) with no response. RN called cousin Ronal Jacobsen at 5:32 PM, (listed as a patient contact) to make aware of patient's death. Per cousin, funeral home selected [facility] [address] [facility] [address] ([place], [place]). Family will see patient at funeral home.

## 2024-04-19 NOTE — Discharge Summary (Signed)
 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 [facility] Course: Eric Bennett is a 78 y.o. male with medical history significant for hypertension, hyperlipidemia, Parkinson disease, and dementia who was sent from his nursing facility for lethargy and purulent urine.  CT is concerning for bilateral lower lobe pneumonia or aspiration, severe cystitis, proctocolitis, and left adrenal nodule.    Patient was started on broad spectrum IV antibiotics and admitted to step down.  He also had severe sepsis secondary to bibasilar pneumonia, urinary tract infection, colitis.  Initially started on broad-spectrum IV antibiotics, albumin .  But he continued to decline.  After discussion with the family, patient was transitioned to comfort measures only.  Patient is currently getting medication to make him only comfort.  Patient was pronounced dead at 5 PM on 04-15-24.   Condition at discharge: Patient passed away  The results of significant diagnostics from this hospitalization (including imaging, microbiology, ancillary and laboratory) are listed below for reference.   Imaging Studies: DG Chest Port 1 View Result Date:  04/04/2024 CLINICAL DATA:  Respiratory complications. EXAM: PORTABLE CHEST 1 VIEW COMPARISON:  04/03/2024 FINDINGS: Heart size and mediastinal contours are unremarkable. Bibasilar opacities are again noted corresponding to areas of atelectasis and airspace disease noted on CT from the previous day. Upper lung zones appear clear. No significant pleural effusion or signs of pneumothorax. Visualized bony structures are intact. IMPRESSION: Bibasilar opacities corresponding to areas of atelectasis and airspace disease noted on CT from the previous day. Electronically Signed   By: Waddell Calk M.D.   On: 04/04/2024 04:58   CT ABDOMEN PELVIS WO CONTRAST Result Date: 04/03/2024 EXAM: CT ABDOMEN AND PELVIS WITHOUT CONTRAST 04/03/2024 06:21:00 PM TECHNIQUE: CT of the abdomen and pelvis was performed without the administration of intravenous contrast. Multiplanar reformatted images are provided for review. Automated exposure control, iterative reconstruction, and/or weight based adjustment of the mA/kV was utilized to reduce the radiation dose to as low as reasonably achievable. COMPARISON: None available. CLINICAL HISTORY: Sepsis. Increased weakness and malaise for 4 days. Staff said urine was chunky. Has a foley catheter and dementia. FINDINGS: LOWER CHEST: Patchy airspace opacities in the bilateral lower lobes compatible with aspiration and/or pneumonia. LIVER: The liver is unremarkable. GALLBLADDER AND BILE DUCTS: Gallbladder is unremarkable. No biliary ductal dilatation. SPLEEN: No acute abnormality. PANCREAS: No acute abnormality. ADRENAL GLANDS: Intermediate density (Hounsfield unit 40) nodule in the left adrenal gland measuring 2.1 cm. KIDNEYS, URETERS AND BLADDER: Nonobstructing left nephrolithiasis. No hydronephrosis. Foley catheter in the markedly thick walled bladder. Gas is also present within the bladder. Marked perivesical fat stranding and fluid. GI AND BOWEL: Stomach demonstrates  no acute abnormality.  There is no bowel obstruction. Wall thickening of the distal sigmoid colon and rectum with adjacent stranding and fluid. PERITONEUM AND RETROPERITONEUM: No free intraperitoneal air. Free fluid and stranding in the pelvis. VASCULATURE: Aortic atherosclerotic calcification. No aortic aneurysm. LYMPH NODES: No lymphadenopathy. REPRODUCTIVE ORGANS: No acute abnormality. BONES AND SOFT TISSUES: No acute fracture. No focal soft tissue abnormality. IMPRESSION: 1. Bilateral lower lobe pneumonia and/or aspiration. 2. Severe cystitis. 3. Proctocolitis of the distal sigmoid colon and rectum. 4. Enlarged prostate. 5. Intermediate density (Hounsfield unit 40) nodule in the left adrenal gland measuring 2.1 cm. Consider nonemergent adrenal protocol CT for further evaluation. Electronically signed by: Norman Gatlin MD 04/03/2024 06:37 PM EDT RP Workstation: [id number]   DG Chest Portable 1 View Result Date: 04/03/2024 CLINICAL DATA:  Sepsis EXAM: PORTABLE CHEST 1 VIEW COMPARISON:  Chest x-ray 08/19/2021 FINDINGS: The heart is enlarged. The lungs are clear. The aorta is ectatic. There is no pleural effusion or pneumothorax. No acute fractures are seen. IMPRESSION: Cardiomegaly. No acute cardiopulmonary process. Electronically Signed   By: Greig Pique M.D.   On: 04/03/2024 16:46    Microbiology: Results for orders placed or performed during the hospital encounter of 04/03/24  Culture, blood (Routine x 2)     Status: None   Collection Time: 04/03/24  3:19 PM   Specimen: BLOOD  Result Value Ref Range Status   Specimen Description   Final    BLOOD LEFT ANTECUBITAL Performed at [facility], [address] [place], [place] [address]    Special Requests   Final    BOTTLES DRAWN AEROBIC AND ANAEROBIC Blood Culture results may not be optimal due to an inadequate volume of blood received in culture bottles Performed at [facility], [address] [place], [place] [address]     Culture   Final    NO GROWTH 5 DAYS Performed at [facility], [address], [place], [place] [address]    Report Status 04/08/2024 FINAL  Final  Culture, blood (Routine x 2)     Status: Abnormal   Collection Time: 04/03/24  3:37 PM   Specimen: BLOOD RIGHT FOREARM  Result Value Ref Range Status   Specimen Description BLOOD RIGHT FOREARM  Final   Special Requests   Final    BOTTLES DRAWN AEROBIC AND ANAEROBIC Blood Culture adequate volume   Culture  Setup Time   Final    GRAM POSITIVE COCCI IN CLUSTERS ANAEROBIC BOTTLE ONLY CRITICAL RESULT CALLED TO, READ BACK BY AND VERIFIED WITH: N. GLOGOVAC  PHARMD, AT 1055 04/04/24 D. VANHOOK    Culture (A)  Final    STAPHYLOCOCCUS HOMINIS THE SIGNIFICANCE OF ISOLATING THIS ORGANISM FROM A SINGLE SET OF BLOOD CULTURES WHEN MULTIPLE SETS ARE DRAWN IS UNCERTAIN. PLEASE NOTIFY THE MICROBIOLOGY DEPARTMENT WITHIN ONE WEEK IF SPECIATION AND SENSITIVITIES ARE REQUIRED. UNABLE TO ISOLATE STAHPHYLOCOCCUS EPIDERMIDIS FROM CULTURE    Report Status 05-03-24 FINAL  Final  Blood Culture ID Panel (Reflexed)     Status: Abnormal   Collection Time: 04/03/24  3:37 PM  Result Value Ref Range Status   Enterococcus faecalis NOT DETECTED NOT DETECTED Final   Enterococcus Faecium NOT DETECTED NOT DETECTED Final   Listeria monocytogenes NOT DETECTED NOT DETECTED Final   Staphylococcus species DETECTED (A) NOT DETECTED Final    Comment: CRITICAL RESULT CALLED TO, READ BACK BY AND VERIFIED WITH: N. GLOGOVAC  PHARMD, AT 1055 04/04/24 D. VANHOOK    Staphylococcus aureus (  BCID) NOT DETECTED NOT DETECTED Final   Staphylococcus epidermidis DETECTED (A) NOT DETECTED Final    Comment: Methicillin (oxacillin) resistant coagulase negative staphylococcus. Possible blood culture contaminant (unless isolated from more than one blood culture draw or clinical case suggests pathogenicity). No antibiotic treatment is indicated for blood  culture contaminants. CRITICAL RESULT  CALLED TO, READ BACK BY AND VERIFIED WITH: N. GLOGOVAC  PHARMD, AT 1055 04/04/24 D. VANHOOK    Staphylococcus lugdunensis NOT DETECTED NOT DETECTED Final   Streptococcus species NOT DETECTED NOT DETECTED Final   Streptococcus agalactiae NOT DETECTED NOT DETECTED Final   Streptococcus pneumoniae NOT DETECTED NOT DETECTED Final   Streptococcus pyogenes NOT DETECTED NOT DETECTED Final   A.calcoaceticus-baumannii NOT DETECTED NOT DETECTED Final   Bacteroides fragilis NOT DETECTED NOT DETECTED Final   Enterobacterales NOT DETECTED NOT DETECTED Final   Enterobacter cloacae complex NOT DETECTED NOT DETECTED Final   Escherichia coli NOT DETECTED NOT DETECTED Final   Klebsiella aerogenes NOT DETECTED NOT DETECTED Final   Klebsiella oxytoca NOT DETECTED NOT DETECTED Final   Klebsiella pneumoniae NOT DETECTED NOT DETECTED Final   Proteus species NOT DETECTED NOT DETECTED Final   Salmonella species NOT DETECTED NOT DETECTED Final   Serratia marcescens NOT DETECTED NOT DETECTED Final   Haemophilus influenzae NOT DETECTED NOT DETECTED Final   Neisseria meningitidis NOT DETECTED NOT DETECTED Final   Pseudomonas aeruginosa NOT DETECTED NOT DETECTED Final   Stenotrophomonas maltophilia NOT DETECTED NOT DETECTED Final   Candida albicans NOT DETECTED NOT DETECTED Final   Candida auris NOT DETECTED NOT DETECTED Final   Candida glabrata NOT DETECTED NOT DETECTED Final   Candida krusei NOT DETECTED NOT DETECTED Final   Candida parapsilosis NOT DETECTED NOT DETECTED Final   Candida tropicalis NOT DETECTED NOT DETECTED Final   Cryptococcus neoformans/gattii NOT DETECTED NOT DETECTED Final   Methicillin resistance mecA/C DETECTED (A) NOT DETECTED Final    Comment: CRITICAL RESULT CALLED TO, READ BACK BY AND VERIFIED WITH: N. GLOGOVAC  PHARMD, AT 1055 04/04/24 D. VANHOOK Performed at [facility], [address], [place], [place] [address]   Urine Culture     Status: Abnormal   Collection Time:  04/03/24  4:03 PM   Specimen: Urine, Random  Result Value Ref Range Status   Specimen Description   Final    URINE, RANDOM Performed at [facility], [address], [place], [place] [address]    Special Requests   Final    NONE Reflexed from [id number] Performed at [facility], [address] [place], [place] [address]    Culture >=100,000 COLONIES/mL PROTEUS MIRABILIS (A)  Final   Report Status 04/05/2024 FINAL  Final   Organism ID, Bacteria PROTEUS MIRABILIS (A)  Final      Susceptibility   Proteus mirabilis - MIC*    AMPICILLIN <=2 SENSITIVE Sensitive     CEFAZOLIN <=4 SENSITIVE Sensitive     CEFEPIME  <=0.12 SENSITIVE Sensitive     CEFTRIAXONE <=0.25 SENSITIVE Sensitive     CIPROFLOXACIN >=4 RESISTANT Resistant     GENTAMICIN <=1 SENSITIVE Sensitive     IMIPENEM 2 SENSITIVE Sensitive     NITROFURANTOIN 128 RESISTANT Resistant     TRIMETH/SULFA <=20 SENSITIVE Sensitive     AMPICILLIN/SULBACTAM <=2 SENSITIVE Sensitive     PIP/TAZO <=4 SENSITIVE Sensitive ug/mL    * >=100,000 COLONIES/mL PROTEUS MIRABILIS  MRSA Next Gen by PCR, Nasal     Status: None   Collection Time: 04/03/24  9:28 PM   Specimen: Nasal Mucosa; Nasal Swab  Result Value Ref Range Status   MRSA by PCR Next Gen NOT DETECTED NOT DETECTED Final    Comment: (NOTE) The GeneXpert MRSA Assay (FDA approved for NASAL specimens only), is one component of a comprehensive MRSA colonization surveillance program. It is not intended to diagnose MRSA infection nor to guide or monitor treatment for MRSA infections. Test performance is not FDA approved in patients less than 63 years old. Performed at [facility], [address] [place], [place] [address]     Labs: CBC: No results for input(s): WBC, NEUTROABS, HGB, HCT, MCV, PLT in the last 168 hours. Basic Metabolic Panel: No results for input(s): NA, K, CL, CO2, GLUCOSE, BUN, CREATININE,  CALCIUM, MG, PHOS in the last 168 hours. Liver Function Tests: No results for input(s): AST, ALT, ALKPHOS, BILITOT, PROT, ALBUMIN  in the last 168 hours. CBG: No results for input(s): GLUCAP in the last 168 hours.  Discharge time spent: less than 30 minutes.  Signed: Nena Rebel, MD [facility] 04/17/2024

## 2024-04-19 NOTE — Plan of Care (Signed)
  Problem: Respiratory: Goal: Ability to maintain adequate ventilation will improve Outcome: Progressing   Problem: Clinical Measurements: Goal: Respiratory complications will improve Outcome: Progressing Goal: Cardiovascular complication will be avoided Outcome: Progressing   Problem: Pain Managment: Goal: General experience of comfort will improve and/or be controlled Outcome: Progressing

## 2024-04-19 NOTE — Progress Notes (Signed)
 Daily Progress Note   Patient Name: Eric Bennett       Date: 2024-04-20 DOB: 04/19/1946  Age: 78 y.o. MRN#: [id number] Attending Physician: Roann Gouty, MD Primary Care Physician: Cloria Annabella CROME, DO Admit Date: 04/03/2024 Length of Stay: 3 days  Reason for Consultation/Follow-up: Establishing goals of care  Subjective:   CC: Patient unresponsive when seen; increased work of breathing noted.  Following up regarding complex medical decision making.  Subjective:  Reviewed EMR prior to presenting to bedside.  At time of EMR patient receiving IV Dilaudid  7 mg/h continuous infusion and Versed  continuous infusion at 6.5 mg/h. Discussed care with RN.  Asked to remove nasal cannula as patient is comfort care and patient should receive medication adjustments if needed for symptom burden.  Presented to bedside to see patient.  No visitors present at bedside.  When counting patient's respiratory rate noted to be elevated at 36 and heart rate elevated.  Asked RN to further titrate IV Dilaudid  for comfort for patient at end-of-life.  Patient also noted to have audible secretions.  Will schedule glycopyrrolate .  Anticipate in-hospital death.  Objective:   Vital Signs:  BP 102/65 (BP Location: Right Arm)   Pulse 80   Temp 98.9 F (37.2 C) (Oral)   Resp 16   Ht 5' 11 (1.803 m)   Wt 61.4 kg   SpO2 (!) 83%   BMI 18.88 kg/m   Physical Exam: General: Ill-appearing, cachectic, frail, unresponsive Cardiovascular: Tachycardia noted Respiratory: increased work of breathing noted Extremities: Muscle wasting in all extremities Neuro: Unresponsive  Assessment & Plan:   Assessment: Patient is a 78 year old male with a past medical history of hypertension, hyperlipidemia, Parkinson's disease, and dementia who was transferred from long-term care nursing facility on 04/03/2024 for management of lethargy and purulent urine.  Upon admission imaging concerning for bilateral lobe pneumonia with possible  aspiration, severe cystitis, proctocolitis, and a left adrenal nodule.  Since admission patient has received management for severe sepsis secondary to bibasilar pneumonia, urinary tract infection, and colitis.  Patient evaluated by SLP and noted to be severe aspiration risk.  Palliative medicine team consulted to assist with complex medical decision making.   Recommendations/Plan: # Complex medical decision making/goals of care:  -Patient unable to participate in medical decision making secondary to medical status.                -Spoke with patient's primary contact/reported HCPOA, niece Vina Daring on 04/04/2024 and patient was transition to full comfort focused care at that time.  Priority is patient's comfort at the end of life.  In-hospital death anticipated.                - Have discontinued interventions that are no longer focused on comfort such as IV fluids, imaging, or lab work.  Focusing on symptom management of pain, dyspnea, and agitation in the setting of end-of-life care.                  Code Status: Do not attempt resuscitation (DNR) - Comfort care   # Symptom management Patient is receiving these palliative interventions for symptom management with an intent to improve quality of life.                   -Pain/Dyspnea, acute in the setting of end-of-life care                Patient was not on medications for pain previously.                               -  Continue IV Dilaudid  continuous infusion with titratable doses and bolus via infusion every 15 minute as needed.  Discussed with RN continuing to adjust based on patient's symptom burden including monitoring respiratory rate and tachycardia.  Continue to adjust based on patient's symptom burden.                  -Anxiety/agitation, in the setting of end-of-life care                               - Continue IV Versed  titratable infusion with as needed boluses.  There is an Ativan  IV shortage so using this instead after patient did  not respond to Haldol .                               - Continue IV Haldol  2 mg every 4 hours as needed. Continue to adjust based on patient's symptom burden.                   -Secretions, in the setting of end-of-life care                               - Change IV glycopyrrolate  0.2 mg every 4 hours scheduled due to audible secretions appreciated.   # Psycho-social/Spiritual Support:  - Support System: Niece - Desire for further Chaplain support:yes   # Discharge Planning:  Anticipated Hospital Death based on current evaluation.  If patient stabilizes, could consider evaluation for inpatient hospice.  Thank you for allowing the palliative care team to participate in the care [facility].  If patient remains symptomatic despite maximum doses, please call PMT at [phone] between 0700 and 1900. Outside of these hours, please call attending, as PMT does not have night coverage.  Tinnie Radar, DO Palliative Care Provider PMT # [phone]  Billing based on MDM: High  Problems Addressed: One acute or chronic illness or injury that poses a threat to life or bodily function  Risks: Parenteral controlled substances

## 2024-04-19 NOTE — Progress Notes (Signed)
 Note enter after discharge. Unable to adjust time of note entry. Note enter on 04-19-2024 to,e 7:32 PM. Pt deceased. A Total of 100mg /152ml midazolam  IV premix infusion wasted in stericycle. A total of 50mg /75ml Hydromorphone  IV premix infusion wasted in stericycle. Witnessed by [employer].

## 2024-04-19 NOTE — Progress Notes (Signed)
  Progress Note   Patient: Eric Bennett FMW:[id number] DOB: 1946-07-31 DOA: 04/03/2024     3 DOS: the patient was seen and examined on 2024-04-13   Brief hospital course: Eric Bennett is a 78 y.o. male with medical history significant for hypertension, hyperlipidemia, Parkinson disease, and dementia who was sent from his nursing facility for lethargy and purulent urine.  CT is concerning for bilateral lower lobe pneumonia or aspiration, severe cystitis, proctocolitis, and left adrenal nodule.    Patient was started on broad spectrum IV antibiotics and admitted to step down.  He also had severe sepsis secondary to bibasilar pneumonia, urinary tract infection, colitis.  Initially started on broad-spectrum IV antibiotics, albumin .  But he continued to decline.  After discussion with the family, patient was transitioned to comfort measures only.  Patient is currently getting medication to make him only comfort.  Assessment and Plan:  Severe sepsis secondary to bibasilar pneumonia, urinary tract infection, colitis He was  initially started on broad-spectrum IV antibiotics, albumin . But he continued to decline.  After discussions with family, patient was transitioned to comfort measures.        Acute respiratory failure with hypoxia requiring NRB initially.  Secondary to bibasilar pneumonia.       Acute metabolic encephalopathy from sepsis secondary to pneumonia and AKI In the setting of Parkinson's disease/ Dementia       In view of multiple medical issues, critically ill, palliative care consulted , After discussions with his niece , he was transitioned to comfort measures.  Appreciate palliative medicine input.        Estimated body mass index is 18.88 kg/m as calculated from the following:   Height as of this encounter: 5' 11 (1.803 m).   Weight as of this encounter: 61.4 kg.   Code Status: DNR comfort  DVT Prophylaxis:  comfort care.       Subjective: Not able to provide  any meaningful history  Physical Exam: Vitals:   04/05/24 1600 04/05/24 1700 04/05/24 1821 04/05/24 1947  BP:   (!) 110/46 102/65  Pulse: (!) 119 (!) 115 80   Resp: 18 19 17 16   Temp:    98.9 F (37.2 C)  TempSrc:    Oral  SpO2: (!) 68% (!) 68% (!) 83% (!) 83%  Weight:      Height:       Patient is breathing comfortably.  Does not respond.  He is on comfort measures only.  Data Reviewed:  There are no new results to review at this time.  Family Communication: None available  Disposition: Status is: Inpatient Remains inpatient appropriate because: On comfort measures only  Planned Discharge Destination: Likely here in the hospital as he is on comfort measures only.    Time spent: 25 minutes  Author: Nena Rebel, MD April 13, 2024 1:25 PM  For on call review [email or web address].

## 2024-04-19 DEATH — deceased

## 2024-06-13 ENCOUNTER — Ambulatory Visit: Payer: Medicare (Managed Care) | Admitting: Neurology
